# Patient Record
Sex: Female | Born: 1980 | Race: White | Hispanic: No | Marital: Married | Smoking: Never smoker
Health system: Southern US, Community
[De-identification: ages and names within clinical notes are randomized; demographics above are authoritative.]

## PROBLEM LIST (undated history)

## (undated) ENCOUNTER — Inpatient Hospital Stay (HOSPITAL_COMMUNITY): Payer: Self-pay

## (undated) ENCOUNTER — Inpatient Hospital Stay (HOSPITAL_COMMUNITY): Payer: BC Managed Care – PPO

## (undated) DIAGNOSIS — K911 Postgastric surgery syndromes: Secondary | ICD-10-CM

## (undated) DIAGNOSIS — F32A Depression, unspecified: Secondary | ICD-10-CM

## (undated) DIAGNOSIS — F329 Major depressive disorder, single episode, unspecified: Secondary | ICD-10-CM

## (undated) DIAGNOSIS — F419 Anxiety disorder, unspecified: Secondary | ICD-10-CM

## (undated) DIAGNOSIS — Z8659 Personal history of other mental and behavioral disorders: Secondary | ICD-10-CM

## (undated) DIAGNOSIS — Z8742 Personal history of other diseases of the female genital tract: Secondary | ICD-10-CM

## (undated) DIAGNOSIS — Z8489 Family history of other specified conditions: Secondary | ICD-10-CM

## (undated) DIAGNOSIS — E282 Polycystic ovarian syndrome: Secondary | ICD-10-CM

## (undated) DIAGNOSIS — Z8619 Personal history of other infectious and parasitic diseases: Secondary | ICD-10-CM

## (undated) DIAGNOSIS — D352 Benign neoplasm of pituitary gland: Secondary | ICD-10-CM

## (undated) HISTORY — DX: Polycystic ovarian syndrome: E28.2

## (undated) HISTORY — DX: Personal history of other infectious and parasitic diseases: Z86.19

## (undated) HISTORY — DX: Personal history of other mental and behavioral disorders: Z86.59

## (undated) HISTORY — DX: Depression, unspecified: F32.A

## (undated) HISTORY — DX: Anxiety disorder, unspecified: F41.9

## (undated) HISTORY — DX: Major depressive disorder, single episode, unspecified: F32.9

---
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
## 2002-06-06 HISTORY — PX: LAPAROSCOPIC CHOLECYSTECTOMY: SUR755

## 2005-03-03 ENCOUNTER — Ambulatory Visit: Payer: Self-pay | Admitting: Obstetrics and Gynecology

## 2012-06-06 DIAGNOSIS — D352 Benign neoplasm of pituitary gland: Secondary | ICD-10-CM

## 2012-06-06 HISTORY — DX: Benign neoplasm of pituitary gland: D35.2

## 2013-03-27 DIAGNOSIS — N915 Oligomenorrhea, unspecified: Secondary | ICD-10-CM | POA: Insufficient documentation

## 2013-03-27 DIAGNOSIS — L680 Hirsutism: Secondary | ICD-10-CM | POA: Insufficient documentation

## 2013-04-01 DIAGNOSIS — E281 Androgen excess: Secondary | ICD-10-CM | POA: Insufficient documentation

## 2013-06-06 HISTORY — PX: TONSILLECTOMY: SUR1361

## 2013-07-17 ENCOUNTER — Ambulatory Visit: Payer: BC Managed Care – PPO | Admitting: Endocrinology

## 2013-07-26 ENCOUNTER — Ambulatory Visit (INDEPENDENT_AMBULATORY_CARE_PROVIDER_SITE_OTHER): Payer: BC Managed Care – PPO | Admitting: Internal Medicine

## 2013-07-26 ENCOUNTER — Encounter (INDEPENDENT_AMBULATORY_CARE_PROVIDER_SITE_OTHER): Payer: Self-pay

## 2013-07-26 ENCOUNTER — Encounter: Payer: Self-pay | Admitting: Internal Medicine

## 2013-07-26 VITALS — BP 112/68 | HR 97 | Temp 98.3°F | Resp 12 | Ht 65.0 in | Wt 203.0 lb

## 2013-07-26 DIAGNOSIS — E282 Polycystic ovarian syndrome: Secondary | ICD-10-CM

## 2013-07-26 DIAGNOSIS — D353 Benign neoplasm of craniopharyngeal duct: Secondary | ICD-10-CM

## 2013-07-26 DIAGNOSIS — D352 Benign neoplasm of pituitary gland: Secondary | ICD-10-CM

## 2013-07-26 NOTE — Patient Instructions (Addendum)
Please return in 02/2014 for another visit. Continue Metformin 500 mg 2x a day. Let me know if you like me to refer you to a Reproductive Endocrinologist. If you do not need a referral, you can call make the appt yourself - I would be glad to send him my notes:  Dr. Marzetta Board [facility] [address] [place], [place], [address]. [phone] - can go to either [facility] or [employer]  Please collect a 24h urine for cortisol:  Patient information (Up-to-Date): Collection of a 24-hour urine specimen  - You should collect every drop of urine during each 24-hour period. It does not matter how much or little urine is passed each time, as long as every drop is collected. - Begin the urine collection in the morning after you wake up, after you have emptied your bladder for the first time. - Urinate (empty the bladder) for the first time and flush it down the toilet. Note the exact time (eg, 6:15 AM). You will begin the urine collection at this time. - Collect every drop of urine during the day and night in an empty collection bottle. Store the bottle at room temperature or in the refrigerator. - If you need to have a bowel movement, any urine passed with the bowel movement should be collected. Try not to include feces with the urine collection. If feces does get mixed in, do not try to remove the feces from the urine collection bottle. - Finish by collecting the first urine passed the next morning, adding it to the collection bottle. This should be within ten minutes before or after the time of the first morning void on the first day (which was flushed). In this example, you would try to void between 6:05 and 6:25 on the second day. - If you need to urinate one hour before the final collection time, drink a full glass of water so that you can void again at the appropriate time. If you have to urinate 20 minutes before, try to hold the urine until the proper  time. - Please note the exact time of the final collection, even if it is not the same time as when collection began on day 1. - The bottle(s) may be kept at room temperature for a day or two, but should be kept cool or refrigerated for longer periods of time.

## 2013-07-26 NOTE — Progress Notes (Signed)
Patient ID: Krystal Carr, female   DOB: December 15, 1980, 33 y.o.   MRN: [id number]   HPI  Krystal Carr is a 33 y.o.-year-old female, referred by her PCP, Dr. Gala Lewandowsky, MD, for evaluation for a pituitary microadenoma.  Patient describes that she initially presented to her PCP last year c/o irregular menses - prior to birth of son in 2010 she has had down to 2 menstrual cycles a year, however, after the birth of her son, she slowly increased the frequency until she started to bleed 2x a month. Also, her anxiety and depression worsened. She also could not lose weight. Her PCP checked a prolactin and this was low and a repeated one was even lower (initially 7 then 4) and she ordered a pituitary MRI (01/05/2013) that showed a 2 mm pituitary microadenoma. She was then referred to an endocrinologist which she has seen in 02/2013 and had further evaluation, which was negative, except for a slightly high DHEAS. We obtained these labs from [facility] (please see below).  Pt c/o: - + weight gain : ~20 lbs in last 4 year. Baseline weigh 160-180 lbs. - stomach and thighs - Had weight loss 15 lbs, then regained - no increased fatigue - no constipation - no dry skin - no hair falling - + depression and anxiety >> started Wellbutrin for 1.5 years >> then weaned off >> now better w/o it - no increase ring or shoe size - no excessive sweating - + HAs - was on Imitrex and Maxalt; now better after birth of son; severe HAs: 1 out of 1-2 mo - + easy bruising - no wide, purple striae - no AP - ? Infertility; no OCPs and no protection for last 3 years  - increased hair on chin in last 1.5 years - no acne - no proximal mm weakness  I reviewed her records from [facility]: - 01/05/2013 pituitary MRI +/- contrast from [facility], read by [facility] radiology: no focal parenchymal signal abnormality identified. No midline shift or mass lesion. No evidence of intracranial hemorrhage or acute infarct. No extra-axial fluid  collection present. No diffusion weighted signal abnormality identified. Mild enlargement of the right pituitary with a 2 mm region of hypoenhancement seen in the superior aspect of the right pituitary. No suprasellar mass. The optic chiasm is normal. Post contrast imaging shows no abnormal areas of contrast enhancement within the pituitary gland. No abnormal enhancement noted elsewhere in the brain. Impression: 2 mm hypoenhancing right pituitary mass, which could represent a microadenoma. - 04/12/2013 MRI abdomen +/- contrast performed due to history of hyperandrogenism: no adrenal mass identified. Multiple tiny follicles in both ovaries. If PCOS is of clinical concern, pelvic ultrasound may be obtained further - 02/20/2013: Initial pituitary visit with Dr. Mamie Levers:  TSH 1.82 (0.6-3.3), Free T4 1.18 DHEAS 361 (31-228)Testosterone 56 (6-77)  IGF-I 171 (77-271) Hemoglobin A1c 4.8% HCG quantitative <5 17 hydroxyprogesterone 52 Repeat DHEAS 03/27/2013: 368  24 hour urine for cortisol was collected, we are waiting for the results Patient was trying to conceive. She already had a son born in 2010, spontaneously conceived after 3 years. Husband had azoospermia (Oct 2014), after using exogenous testosterone. Pt had irregular menstrual cycles, which are not new for her, they were irregular prior to pregnancy, approximately 3-6 per year, now more frequently. She also complains of migraines. Denied gynecomastia or galactorrhea. Denied enlargement of the ring or shoe size, no excessive sweating. She has gained weight 18 pounds in the last year; her BMI at the  time of the visit was 32.7. It was mentioned that the patient complained of infertility and hirsutism and her labs show elevated DHEAS.  Labs prior to this visit: 01/05/2013: Prolactin 4.0 12/28/2012: LH 13, FSH 6, TSH 4.48 (0.45-4.5), prolactin 4.6, total T4 8.9 - all normal 07/23/2012: Prolactin 7.2, TSH 3.09, estrogen 155, LH 10, FSH 5, hCG less  than 1, CMP normal CBC normal  She was started on metformin for presumed PCOS >> she started it last month.  ROS: Constitutional: see HPI Eyes: no blurry vision, no xerophthalmia ENT: no sore throat, + nodules palpated in throat, no dysphagia/odynophagia, no hoarseness Cardiovascular: no CP/SOB/palpitations/leg swelling Respiratory: no cough/SOB Gastrointestinal: no N/V/D/C Musculoskeletal: no muscle/joint aches Skin: no rashes Neurological: no tremors/numbness/tingling/dizziness Psychiatric: + depression/ + anxiety Low libido  Past Medical History  Diagnosis Date  . Allergy   . Anxiety   . Depression    Past Surgical History  Procedure Laterality Date  . Appendectomy  1997  . Cholecystectomy  2004   History   Social History  . Marital Status: Married    Spouse Name: N/A    Number of Children: 48, son born 2010    Occupational History  . Receptionist @ [employer]   Social History Main Topics  . Smoking status: Never Smoker   . Smokeless tobacco: No  . Alcohol Use: No  . Drug Use: No  . Sexual Activity: unrotected intercourse x last 3 years   Current Outpatient Rx  Name  Route  Sig  Dispense  Refill  . colestipol (COLESTID) 1 G tablet   Oral   Take 1 g by mouth daily.         Marland Kitchen loratadine (CLARITIN) 10 MG tablet   Oral   Take 10 mg by mouth daily.         . metFORMIN (GLUCOPHAGE) 500 MG tablet   Oral   Take 500 mg by mouth 2 (two) times daily with a meal.         . triamcinolone (NASACORT AQ) 55 MCG/ACT AERO nasal inhaler   Nasal   Place 1 spray into the nose 2 (two) times daily.          Allergies  Allergen Reactions  . Ceclor [Cefaclor] Hives  . Imitrex [Sumatriptan] Swelling    Facial and oral swelling  . Minocycline Hives   Family History  Problem Relation Age of Onset  . Hyperlipidemia Mother   . Cancer Mother 71    Breast Cancer   . Hypertension Father   . Heart disease Father   . Cancer Paternal Grandmother     Breast  Cancer    PE: BP 112/68  Pulse 97  Temp(Src) 98.3 F (36.8 C) (Oral)  Resp 12  Ht 5\' 5"  (1.651 m)  Wt 203 lb (92.08 kg)  BMI 33.78 kg/m2  SpO2 97%  LMP 07/15/2013 Wt Readings from Last 3 Encounters:  07/26/13 203 lb (92.08 kg)   Constitutional: overweight, in NAD, moon facies Eyes: PERRLA, EOMI, no exophthalmos ENT: moist mucous membranes, no thyromegaly, no cervical lymphadenopathy Cardiovascular: RRR, No MRG Respiratory: CTA B Gastrointestinal: abdomen soft, NT, ND, BS+ Musculoskeletal: no deformities, strength intact in all 4 Skin: moist, warm, no rashes, vellum on sideburns, darker hair shafts on chin. No skin tags. Neurological: no tremor with outstretched hands, DTR normal in all 4  ASSESSMENT: 1. Pituitary microadenoma - 2 mm   2. PCOS - multiple ovarian follicles per abdominal MRI - high DHEAS - normal  adrenals per abdominal MRI  PLAN:  1. Pituitary microadenoma - I discussed with the patient that this is very small, and, per her pituitary labs checked in /2014, the pituitary function is normal: - IGF-I is normal, therefore no acromegaly - Her prolactin level was normal, therefore no prolactinoma - thyroid tests were normal, therefore no TSHoma - she was also asked to provide a urine collection, which he did, however this was lost by the lab (lab corp) - she has no symptoms and signs that might indicate adrenal insufficiency - in this case, I suggested that I see her again in 02/2014, which will be a year after her previous hormonal evaluation, and we will repeat her pituitary function - I would like to get a 24-hour urine cortisol collection, however, and I gave her instructions about how to do that - We discussed about a repeat MRI, which is indicated at 1-2 years after diagnosis in case of a microadenoma, or tumor <1 cm (and 6 months after diagnosis in case of a macroadenoma, or a tumor >1 cm)  2. Elevated DHEAS - Most likely PCOS-related; however, we  discussed about the possibility that she has an isolated increased secretion of DHEAS - I would recheck her DHEAS and testosterone at next visit - we discussed about the fact that she should stay on metformin, and the current dose of 500 mg twice a day which she just started, and if she does not get pregnant in the next 6 months, I can refer her to reproductive endocrinology. If she does get pregnant, I would continue the metformin for the duration of the pregnancy. We also would need to have her followed by High-Risk OB/GYN. - patient agrees with the plan  Component     Latest Ref Rng 07/29/2013  Cortisol (Ur), Free     4.0 - 50.0 mcg/24 h 30.3  Results received     0.63 - 2.50 g/24 h 1.96  Creatinine, Urine      141.3  Creatinine, 24H Ur     700 - 1800 mg/day 1978 (H)  No elevated cortisol in urine. Cr a little high.

## 2013-07-29 LAB — CREATININE, URINE, 24 HOUR
CREATININE, URINE: 141.3 mg/dL
Creatinine, 24H Ur: 1978 mg/d — ABNORMAL HIGH (ref 700–1800)

## 2013-07-31 ENCOUNTER — Encounter: Payer: Self-pay | Admitting: Internal Medicine

## 2013-08-05 ENCOUNTER — Encounter: Payer: Self-pay | Admitting: Internal Medicine

## 2013-08-05 DIAGNOSIS — D352 Benign neoplasm of pituitary gland: Secondary | ICD-10-CM | POA: Insufficient documentation

## 2013-08-05 DIAGNOSIS — E282 Polycystic ovarian syndrome: Secondary | ICD-10-CM | POA: Insufficient documentation

## 2013-08-05 LAB — CORTISOL, URINE, 24 HOUR
Cortisol (Ur), Free: 30.3 mcg/24 h (ref 4.0–50.0)
RESULTS RECEIVED: 1.96 g/(24.h) (ref 0.63–2.50)

## 2013-08-12 ENCOUNTER — Encounter: Payer: Self-pay | Admitting: Internal Medicine

## 2013-11-04 ENCOUNTER — Encounter: Payer: Self-pay | Admitting: Internal Medicine

## 2013-11-06 ENCOUNTER — Other Ambulatory Visit: Payer: Self-pay | Admitting: Internal Medicine

## 2013-11-06 DIAGNOSIS — D352 Benign neoplasm of pituitary gland: Secondary | ICD-10-CM

## 2013-11-22 ENCOUNTER — Ambulatory Visit
Admission: RE | Admit: 2013-11-22 | Discharge: 2013-11-22 | Disposition: A | Discharge status: Home / Self Care | Payer: BC Managed Care – PPO | Source: Ambulatory Visit | Attending: Internal Medicine | Admitting: Internal Medicine

## 2013-11-22 MED ORDER — GADOBENATE DIMEGLUMINE 529 MG/ML IV SOLN
9.0000 mL | Freq: Once | INTRAVENOUS | Status: AC | PRN
  Administered 2013-11-22: 9 mL via INTRAVENOUS

## 2013-12-18 DIAGNOSIS — N469 Male infertility, unspecified: Secondary | ICD-10-CM | POA: Insufficient documentation

## 2014-02-28 ENCOUNTER — Ambulatory Visit: Payer: BC Managed Care – PPO | Admitting: Internal Medicine

## 2014-03-27 ENCOUNTER — Ambulatory Visit: Payer: BC Managed Care – PPO | Admitting: Internal Medicine

## 2014-06-06 ENCOUNTER — Telehealth: Payer: Self-pay | Admitting: Nurse Practitioner

## 2014-06-06 DIAGNOSIS — N3000 Acute cystitis without hematuria: Secondary | ICD-10-CM

## 2014-06-06 MED ORDER — SULFAMETHOXAZOLE-TRIMETHOPRIM 800-160 MG PO TABS: 1.0000 | ORAL_TABLET | Freq: Two times a day (BID) | ORAL | Status: DC

## 2014-06-06 NOTE — L&D Delivery Note (Signed)
Delivery Note At 8:10 PM a viable female was delivered via NSVD (Presentation:OA ;  ).  APGAR:8/9 , ; weight  .   Placenta status:INTACT , . 3 VESSEL Cord:  with the following complications:NONE .  Cord pH: NA  Anesthesia: Epidural  Episiotomy: None Lacerations: 1st degree;Vaginal Suture Repair: 2.0 chromic Est. Blood Loss (mL):  350CC  Mom to postpartum.  Baby to Couplet care / Skin to Skin.  Solan Vosler S 05/08/2015, 8:21 PM

## 2014-06-06 NOTE — Progress Notes (Signed)

## 2014-10-02 LAB — OB RESULTS CONSOLE HEPATITIS B SURFACE ANTIGEN: HEP B S AG: NEGATIVE

## 2014-10-02 LAB — OB RESULTS CONSOLE RPR: RPR: NONREACTIVE

## 2014-10-02 LAB — OB RESULTS CONSOLE ABO/RH: RH Type: POSITIVE

## 2014-10-02 LAB — OB RESULTS CONSOLE ANTIBODY SCREEN: ANTIBODY SCREEN: NEGATIVE

## 2014-10-02 LAB — OB RESULTS CONSOLE GC/CHLAMYDIA
CHLAMYDIA, DNA PROBE: NEGATIVE
GC PROBE AMP, GENITAL: NEGATIVE

## 2014-10-02 LAB — OB RESULTS CONSOLE HIV ANTIBODY (ROUTINE TESTING): HIV: NONREACTIVE

## 2014-10-02 LAB — OB RESULTS CONSOLE RUBELLA ANTIBODY, IGM: Rubella: IMMUNE

## 2014-12-14 ENCOUNTER — Telehealth: Payer: 59 | Admitting: Family

## 2014-12-14 DIAGNOSIS — R112 Nausea with vomiting, unspecified: Secondary | ICD-10-CM

## 2014-12-14 MED ORDER — PROMETHAZINE HCL 12.5 MG PO TABS: 12.5000 mg | ORAL_TABLET | Freq: Four times a day (QID) | ORAL | Status: DC | PRN

## 2014-12-14 NOTE — Progress Notes (Signed)
We are sorry that you are not feeling well.  Here is how we plan to help!  Based on what you have shared with me it looks like you have Acute Infectious Diarrhea.  Most cases of acute diarrhea are due to infections with virus and bacteria and are self-limited conditions lasting less than 14 days.  For your symptoms you may take Imodium 2 mg tablets that are over the counter at your local pharmacy. Take two tablet now and then one after each loose stool up to 6 a day.  Antibiotics are not needed for most people with diarrhea.  I have sent Phenergan 12.5mg  every 6 hours as needed for nausea/vomiting.   As per our phone conversation, both Phenergan and Imodium are Category C. However, the research indicates the highest risk during the first 12 weeks and last 8 weeks of pregnancy for these medications and you indicated that you are at 19 weeks. Therefore, the risk is likely much less in your particular case. Additionally, you must weigh the risk-benefit ratio in this situation in that if you continue to vomit, you risk becoming dehydrated, deficient in vital nutrients, and in a poor state of health which will also negatively impact your baby's health. Therefore, short-term use of these medications may prevent long-term illness and detrimental effects. I will send the prescriptions and you can make an informed decision about taking them if you choose to do so.   HOME CARE  We recommend changing your diet to help with your symptoms for the next few days.  Drink plenty of fluids that contain water salt and sugar. Sports drinks such as Gatorade may help.   You may try broths, soups, bananas, applesauce, soft breads, mashed potatoes or crackers.   You are considered infectious for as long as the diarrhea continues. Hand washing or use of alcohol based hand sanitizers is recommend.  It is best to stay out of work or school until your symptoms stop.   GET HELP RIGHT AWAY  If you have dark yellow  colored urine or do not pass urine frequently you should drink more fluids.    If your symptoms worsen   If you feel like you are going to pass out (faint)  You have a new problem  MAKE SURE YOU   Understand these instructions.  Will watch your condition.  Will get help right away if you are not doing well or get worse.  Your e-visit answers were reviewed by a board certified advanced clinical practitioner to complete your personal care plan.  Depending on the condition, your plan could have included both over the counter or prescription medications.  If there is a problem please reply  once you have received a response from your provider.  Your safety is important to [place].  If you have drug allergies check your prescription carefully.    You can use MyChart to ask questions about today's visit, request a non-urgent call back, or ask for a work or school excuse.  You will get an e-mail in the next two days asking about your experience.  I hope that your e-visit has been valuable and will speed your recovery. Thank you for using e-visits.

## 2015-01-24 ENCOUNTER — Encounter (HOSPITAL_COMMUNITY): Payer: Self-pay | Admitting: *Deleted

## 2015-01-24 ENCOUNTER — Inpatient Hospital Stay (HOSPITAL_COMMUNITY)
Admission: AD | Admit: 2015-01-24 | Discharge: 2015-01-25 | Disposition: A | Discharge status: Home / Self Care | Payer: BLUE CROSS/BLUE SHIELD | Source: Ambulatory Visit | Attending: Obstetrics and Gynecology | Admitting: Obstetrics and Gynecology

## 2015-01-24 DIAGNOSIS — Z3689 Encounter for other specified antenatal screening: Secondary | ICD-10-CM

## 2015-01-24 DIAGNOSIS — O368120 Decreased fetal movements, second trimester, not applicable or unspecified: Secondary | ICD-10-CM | POA: Diagnosis present

## 2015-01-24 DIAGNOSIS — Z3A24 24 weeks gestation of pregnancy: Secondary | ICD-10-CM | POA: Diagnosis not present

## 2015-01-24 DIAGNOSIS — O368121 Decreased fetal movements, second trimester, fetus 1: Secondary | ICD-10-CM

## 2015-01-24 LAB — URINALYSIS, ROUTINE W REFLEX MICROSCOPIC
Bilirubin Urine: NEGATIVE
GLUCOSE, UA: 100 mg/dL — AB
HGB URINE DIPSTICK: NEGATIVE
Ketones, ur: NEGATIVE mg/dL
LEUKOCYTES UA: NEGATIVE
Nitrite: NEGATIVE
Protein, ur: NEGATIVE mg/dL
SPECIFIC GRAVITY, URINE: 1.01 (ref 1.005–1.030)
UROBILINOGEN UA: 0.2 mg/dL (ref 0.0–1.0)
pH: 6 (ref 5.0–8.0)

## 2015-01-24 NOTE — Discharge Instructions (Signed)

## 2015-01-24 NOTE — MAU Provider Note (Signed)
Chief Complaint:  Decreased Fetal Movement   First Provider Initiated Contact with Patient 01/24/15 2345      HPI: Krystal Carr is a 34 y.o. G3P1011 at 59w3dwho presents to maternity admissions reporting decreased fetal movement today. She reports feeling large kick this morning then no movement all day until this afternoon when she felt some small flutters only.  She is feeling movement since arriving in MAU.   She denies abdominal pain, LOF, vaginal bleeding, vaginal itching/burning, urinary symptoms, h/a, dizziness, n/v, or fever/chills.    HPI  Past Medical History: Past Medical History  Diagnosis Date  . Allergy   . Anxiety   . Depression     Past obstetric history: OB History  Gravida Para Term Preterm AB SAB TAB Ectopic Multiple Living  3 1 1  1 1    1     # Outcome Date GA Lbr Len/2nd Weight Sex Delivery Anes PTL Lv  3 Current           2 SAB 01/14/14          1 Term 04/21/09    M Vag-Spont   Y      Past Surgical History: Past Surgical History  Procedure Laterality Date  . Appendectomy  1997  . Cholecystectomy  2004    Family History: Family History  Problem Relation Age of Onset  . Hyperlipidemia Mother   . Cancer Mother 36    Breast Cancer   . Hypertension Father   . Heart disease Father   . Cancer Paternal Grandmother     Breast Cancer     Social History: Social History  Substance Use Topics  . Smoking status: Never Smoker   . Smokeless tobacco: None  . Alcohol Use: No    Allergies:  Allergies  Allergen Reactions  . Ceclor [Cefaclor] Hives  . Imitrex [Sumatriptan] Swelling    Facial and oral swelling  . Minocycline Hives    Meds:  Prescriptions prior to admission  Medication Sig Dispense Refill Last Dose  . colestipol (COLESTID) 1 G tablet Take 1 g by mouth daily.   01/24/2015 at Unknown time  . Prenatal Vit-Fe Fumarate-FA (PRENATAL MULTIVITAMIN) TABS tablet Take 1 tablet by mouth daily at 12 noon.   01/24/2015 at Unknown time  .  loratadine (CLARITIN) 10 MG tablet Take 10 mg by mouth daily.   Taking  . metFORMIN (GLUCOPHAGE) 500 MG tablet Take 500 mg by mouth 2 (two) times daily with a meal.   Taking  . promethazine (PHENERGAN) 12.5 MG tablet Take 1 tablet (12.5 mg total) by mouth every 6 (six) hours as needed for nausea or vomiting. 20 tablet 0 More than a month at Unknown time  . sulfamethoxazole-trimethoprim (BACTRIM DS,SEPTRA DS) 800-160 MG per tablet Take 1 tablet by mouth 2 (two) times daily. 14 tablet 0   . triamcinolone (NASACORT AQ) 55 MCG/ACT AERO nasal inhaler Place 1 spray into the nose 2 (two) times daily.   Taking    ROS:  Review of Systems  Constitutional: Negative for fever, chills and fatigue.  HENT: Negative for sinus pressure.   Eyes: Negative for photophobia.  Respiratory: Negative for shortness of breath.   Cardiovascular: Negative for chest pain.  Gastrointestinal: Negative for nausea, vomiting, diarrhea and constipation.  Genitourinary: Negative for dysuria, frequency, flank pain, vaginal bleeding, vaginal discharge, difficulty urinating, vaginal pain and pelvic pain.  Musculoskeletal: Negative for neck pain.  Neurological: Negative for dizziness, weakness and headaches.  Psychiatric/Behavioral: Negative.  I have reviewed patient's Past Medical Hx, Surgical Hx, Family Hx, Social Hx, medications and allergies.   Physical Exam  Patient Vitals for the past 24 hrs:  BP Temp Pulse Resp SpO2 Height Weight  01/24/15 2339 137/82 mmHg - 107 - - - -  01/24/15 2315 - - - - 100 % - -  01/24/15 2310 144/84 mmHg 98.4 F (36.9 C) 116 18 - 5\' 5"  (1.651 m) 95.89 kg (211 lb 6.4 oz)   Constitutional: Well-developed, well-nourished female in no acute distress.  Cardiovascular: normal rate Respiratory: normal effort GI: Abd soft, non-tender, gravid appropriate for gestational age. MS: Extremities nontender, no edema, normal ROM Neurologic: Alert and oriented x 4.  GU: Deferred     FHT:   Baseline 145 , moderate variability, accelerations present, no decelerations Contractions: None on toco or to palpation   Labs: Results for orders placed or performed during the hospital encounter of 01/24/15 (from the past 24 hour(s))  Urinalysis, Routine w reflex microscopic (not at [facility])     Status: Abnormal   Collection Time: 01/24/15 11:20 PM  Result Value Ref Range   Color, Urine YELLOW YELLOW   APPearance CLEAR CLEAR   Specific Gravity, Urine 1.010 1.005 - 1.030   pH 6.0 5.0 - 8.0   Glucose, UA 100 (A) NEGATIVE mg/dL   Hgb urine dipstick NEGATIVE NEGATIVE   Bilirubin Urine NEGATIVE NEGATIVE   Ketones, ur NEGATIVE NEGATIVE mg/dL   Protein, ur NEGATIVE NEGATIVE mg/dL   Urobilinogen, UA 0.2 0.0 - 1.0 mg/dL   Nitrite NEGATIVE NEGATIVE   Leukocytes, UA NEGATIVE NEGATIVE    MAU Course/MDM: I have ordered labs and reviewed results.  Consult Dr Matthew Saras, reviewed FHR tracing.  Pt stable at time of discharge.  Assessment: 1. Decreased fetal movement, second trimester, fetus 1   2. NST (non-stress test) reactive     Plan: Consult Dr Matthew Saras, reviewed assessment and FHR tracing Discharge home PTL precautions and fetal kick counts  Follow-up Information    Follow up with Margarette Asal, MD.   Specialty:  Obstetrics and Gynecology   Why:  As scheduled   Contact information:   [address] [place] [address] [phone]       Follow up with [place].   Why:  As needed for emergencies   Contact information:   [address] [phone] [place] [phone]       Medication List    STOP taking these medications        sulfamethoxazole-trimethoprim 800-160 MG per tablet  Commonly known as:  BACTRIM DS,SEPTRA DS      TAKE these medications        colestipol 1 G tablet  Commonly known as:  COLESTID  Take 1 g by mouth daily.     loratadine 10 MG tablet   Commonly known as:  CLARITIN  Take 10 mg by mouth daily.     metFORMIN 500 MG tablet  Commonly known as:  GLUCOPHAGE  Take 500 mg by mouth 2 (two) times daily with a meal.     NASACORT AQ 55 MCG/ACT Aero nasal inhaler  Generic drug:  triamcinolone  Place 1 spray into the nose 2 (two) times daily.     prenatal multivitamin Tabs tablet  Take 1 tablet by mouth daily at 12 noon.     promethazine 12.5 MG tablet  Commonly known as:  PHENERGAN  Take 1 tablet (12.5 mg  total) by mouth every 6 (six) hours as needed for nausea or vomiting.        Fatima Blank Certified Nurse-Midwife 01/24/2015 11:57 PM

## 2015-01-24 NOTE — Progress Notes (Signed)
Baby active and pt aware of FM

## 2015-01-24 NOTE — MAU Note (Signed)
Decreased FM for past 24hrs. Feeling some FM but not like usual. Ankels swollen last few days but better today after being off feet. Alittle SOB past few days at times

## 2015-01-25 DIAGNOSIS — O368120 Decreased fetal movements, second trimester, not applicable or unspecified: Secondary | ICD-10-CM | POA: Diagnosis not present

## 2015-01-25 NOTE — Progress Notes (Signed)
Fatima Blank CNM in to see pt

## 2015-01-25 NOTE — Progress Notes (Signed)
Written and verbal d/c instructions given and understanding vocied. Fetal kick count reviewed and voices understanding

## 2015-03-16 ENCOUNTER — Inpatient Hospital Stay (HOSPITAL_COMMUNITY)
Admission: AD | Admit: 2015-03-16 | Discharge: 2015-03-16 | Disposition: A | Discharge status: Home / Self Care | Payer: BLUE CROSS/BLUE SHIELD | Source: Ambulatory Visit | Attending: Obstetrics & Gynecology | Admitting: Obstetrics & Gynecology

## 2015-03-16 ENCOUNTER — Encounter (HOSPITAL_COMMUNITY): Payer: Self-pay | Admitting: *Deleted

## 2015-03-16 DIAGNOSIS — D649 Anemia, unspecified: Secondary | ICD-10-CM

## 2015-03-16 DIAGNOSIS — O99013 Anemia complicating pregnancy, third trimester: Secondary | ICD-10-CM | POA: Diagnosis not present

## 2015-03-16 DIAGNOSIS — R000 Tachycardia, unspecified: Secondary | ICD-10-CM | POA: Diagnosis not present

## 2015-03-16 DIAGNOSIS — R51 Headache: Secondary | ICD-10-CM

## 2015-03-16 DIAGNOSIS — O26893 Other specified pregnancy related conditions, third trimester: Secondary | ICD-10-CM

## 2015-03-16 DIAGNOSIS — Z3A31 31 weeks gestation of pregnancy: Secondary | ICD-10-CM | POA: Diagnosis not present

## 2015-03-16 LAB — CBC
HEMATOCRIT: 33.1 % — AB (ref 36.0–46.0)
Hemoglobin: 10.9 g/dL — ABNORMAL LOW (ref 12.0–15.0)
MCH: 27.6 pg (ref 26.0–34.0)
MCHC: 32.9 g/dL (ref 30.0–36.0)
MCV: 83.8 fL (ref 78.0–100.0)
PLATELETS: 233 10*3/uL (ref 150–400)
RBC: 3.95 MIL/uL (ref 3.87–5.11)
RDW: 13.8 % (ref 11.5–15.5)
WBC: 8 10*3/uL (ref 4.0–10.5)

## 2015-03-16 LAB — COMPREHENSIVE METABOLIC PANEL
ALT: 14 U/L (ref 14–54)
AST: 13 U/L — AB (ref 15–41)
Albumin: 2.8 g/dL — ABNORMAL LOW (ref 3.5–5.0)
Alkaline Phosphatase: 83 U/L (ref 38–126)
Anion gap: 6 (ref 5–15)
BUN: 7 mg/dL (ref 6–20)
CHLORIDE: 107 mmol/L (ref 101–111)
CO2: 23 mmol/L (ref 22–32)
CREATININE: 0.54 mg/dL (ref 0.44–1.00)
Calcium: 8.4 mg/dL — ABNORMAL LOW (ref 8.9–10.3)
GFR calc Af Amer: >60 mL/min (ref >60)
Glucose, Bld: 86 mg/dL (ref 65–99)
Potassium: 3.8 mmol/L (ref 3.5–5.1)
Sodium: 136 mmol/L (ref 135–145)
Total Bilirubin: 0.3 mg/dL (ref 0.3–1.2)
Total Protein: 6.7 g/dL (ref 6.5–8.1)

## 2015-03-16 LAB — URINALYSIS, ROUTINE W REFLEX MICROSCOPIC
BILIRUBIN URINE: NEGATIVE
Glucose, UA: 250 mg/dL — AB
Hgb urine dipstick: NEGATIVE
Ketones, ur: 15 mg/dL — AB
LEUKOCYTES UA: NEGATIVE
NITRITE: NEGATIVE
Protein, ur: NEGATIVE mg/dL
SPECIFIC GRAVITY, URINE: 1.025 (ref 1.005–1.030)
UROBILINOGEN UA: 0.2 mg/dL (ref 0.0–1.0)
pH: 6 (ref 5.0–8.0)

## 2015-03-16 NOTE — MAU Note (Signed)
Patient presents at [redacted] weeks gestation with c/o intermittent hypertension X couple of weeks, tachycardia, nausea and headache X 2 days. Went to family doctor today. Vitals taken and EKG completed. Assessment completed as well and then family doctor consulted her OB-GYN who directed her to MAU. Fetus active. Denies bleeding or discharge.

## 2015-03-16 NOTE — MAU Provider Note (Signed)
History     CSN: [id number]  Arrival date and time: 03/16/15 1844   First Provider Initiated Contact with Patient 03/16/15 1930         Chief Complaint  Patient presents with  . Hypertension  . Headache  . Nausea  . Tachycardia   HPI Krystal Carr is a 34 y.o. G3P1011 at [redacted]w[redacted]d who was sent from her PCP for headache, tachycardia & elevated BP.  Went to PCP today b/c BP was up at home & felt like heart was racing. Evaluation done there, pt came with records. EKG at office - sinus tachycardia, otherwise normal.  Headaches off & on x 2 days, states worse when BP is up. Took tylenol today at 1230 with some relief.  Currently rates headache as 3/10.  Denies vision changes.  Nausea since Saturday. Vomited once on Saturday, none since. Has zofran & phenergan at home but hasn't taken anything.  Denies ctx, LOF, vaginal bleeding.  Positive fetal movement.     OB History    Gravida Para Term Preterm AB TAB SAB Ectopic Multiple Living   3 1 1  1  1   1       Past Medical History  Diagnosis Date  . Allergy   . Anxiety   . Depression     Past Surgical History  Procedure Laterality Date  . Appendectomy  1997  . Cholecystectomy  2004    Family History  Problem Relation Age of Onset  . Hyperlipidemia Mother   . Cancer Mother 45    Breast Cancer   . Hypertension Father   . Heart disease Father   . Cancer Paternal Grandmother     Breast Cancer     Social History  Substance Use Topics  . Smoking status: Never Smoker   . Smokeless tobacco: None  . Alcohol Use: No    Allergies:  Allergies  Allergen Reactions  . Ceclor [Cefaclor] Hives  . Imitrex [Sumatriptan] Swelling    Facial and oral swelling  . Minocycline Hives    Prescriptions prior to admission  Medication Sig Dispense Refill Last Dose  . colestipol (COLESTID) 1 G tablet Take 1 g by mouth daily.   01/24/2015 at Unknown time  . loratadine (CLARITIN) 10 MG tablet Take 10 mg by mouth daily.   Taking  .  metFORMIN (GLUCOPHAGE) 500 MG tablet Take 500 mg by mouth 2 (two) times daily with a meal.   Taking  . Prenatal Vit-Fe Fumarate-FA (PRENATAL MULTIVITAMIN) TABS tablet Take 1 tablet by mouth daily at 12 noon.   01/24/2015 at Unknown time  . promethazine (PHENERGAN) 12.5 MG tablet Take 1 tablet (12.5 mg total) by mouth every 6 (six) hours as needed for nausea or vomiting. 20 tablet 0 More than a month at Unknown time  . triamcinolone (NASACORT AQ) 55 MCG/ACT AERO nasal inhaler Place 1 spray into the nose 2 (two) times daily.   Taking    Review of Systems  Constitutional: Negative.   Eyes: Negative for blurred vision and double vision.  Respiratory: Negative.   Cardiovascular: Positive for palpitations. Negative for chest pain.  Gastrointestinal: Positive for nausea. Negative for vomiting.  Genitourinary: Negative.   Neurological: Positive for headaches. Negative for dizziness.   Physical Exam   Blood pressure 113/74, pulse 112, temperature 98.2 F (36.8 C), temperature source Oral, resp. rate 18, height 5\' 5"  (1.651 m), weight 211 lb (95.709 kg), SpO2 100 %.  Patient Vitals for the past 24 hrs:  BP Temp  Temp src Pulse Resp SpO2 Height Weight  03/16/15 2002 110/76 mmHg - - 102 - - - -  03/16/15 1947 107/73 mmHg - - 100 - - - -  03/16/15 1932 106/72 mmHg - - 111 - - - -  03/16/15 1922 113/74 mmHg 98.2 F (36.8 C) Oral 112 18 100 % 5\' 5"  (1.651 m) 211 lb (95.709 kg)   Fetal Tracing:  Baseline: 145 Variability: moderate Accelerations: 15x15 Decelerations: none  Toco: none    Physical Exam  Nursing note and vitals reviewed. Constitutional: She is oriented to person, place, and time. She appears well-developed and well-nourished. No distress.  HENT:  Head: Normocephalic and atraumatic.  Eyes: Conjunctivae are normal. Right eye exhibits no discharge. Left eye exhibits no discharge. No scleral icterus.  Neck: Normal range of motion.  Cardiovascular: Normal rate, regular rhythm and  normal heart sounds.   No murmur heard. Respiratory: Effort normal and breath sounds normal. No respiratory distress. She has no wheezes.  GI: Soft.  Neurological: She is alert and oriented to person, place, and time. She has normal reflexes.  No clonus  Skin: Skin is warm and dry. She is not diaphoretic.  Psychiatric: She has a normal mood and affect. Her behavior is normal. Judgment and thought content normal.    MAU Course  Procedures Results for orders placed or performed during the hospital encounter of 03/16/15 (from the past 24 hour(s))  Urinalysis, Routine w reflex microscopic (not at [facility])     Status: Abnormal   Collection Time: 03/16/15  7:03 PM  Result Value Ref Range   Color, Urine YELLOW YELLOW   APPearance CLEAR CLEAR   Specific Gravity, Urine 1.025 1.005 - 1.030   pH 6.0 5.0 - 8.0   Glucose, UA 250 (A) NEGATIVE mg/dL   Hgb urine dipstick NEGATIVE NEGATIVE   Bilirubin Urine NEGATIVE NEGATIVE   Ketones, ur 15 (A) NEGATIVE mg/dL   Protein, ur NEGATIVE NEGATIVE mg/dL   Urobilinogen, UA 0.2 0.0 - 1.0 mg/dL   Nitrite NEGATIVE NEGATIVE   Leukocytes, UA NEGATIVE NEGATIVE  CBC     Status: Abnormal   Collection Time: 03/16/15  8:04 PM  Result Value Ref Range   WBC 8.0 4.0 - 10.5 K/uL   RBC 3.95 3.87 - 5.11 MIL/uL   Hemoglobin 10.9 (L) 12.0 - 15.0 g/dL   HCT 33.1 (L) 36.0 - 46.0 %   MCV 83.8 78.0 - 100.0 fL   MCH 27.6 26.0 - 34.0 pg   MCHC 32.9 30.0 - 36.0 g/dL   RDW 13.8 11.5 - 15.5 %   Platelets 233 150 - 400 K/uL  Comprehensive metabolic panel     Status: Abnormal   Collection Time: 03/16/15  8:04 PM  Result Value Ref Range   Sodium 136 135 - 145 mmol/L   Potassium 3.8 3.5 - 5.1 mmol/L   Chloride 107 101 - 111 mmol/L   CO2 23 22 - 32 mmol/L   Glucose, Bld 86 65 - 99 mg/dL   BUN 7 6 - 20 mg/dL   Creatinine, Ser 0.54 0.44 - 1.00 mg/dL   Calcium 8.4 (L) 8.9 - 10.3 mg/dL   Total Protein 6.7 6.5 - 8.1 g/dL   Albumin 2.8 (L) 3.5 - 5.0 g/dL   AST 13 (L) 15 - 41  U/L   ALT 14 14 - 54 U/L   Alkaline Phosphatase 83 38 - 126 U/L   Total Bilirubin 0.3 0.3 - 1.2 mg/dL   GFR calc non Af  Amer >60 >60 mL/min   GFR calc Af Amer >60 >60 mL/min   Anion gap 6 5 - 15    MDM 1947- C/w Dr. Lynnette Caffey. CBC, CMP, BP monitoring & urinalysis Category 1 tracing, no contractions 2042- Discussed results with Dr. Lynnette Caffey. Ok to discharge home.  Assessment and Plan  A: 1. Tachycardia with 100 - 120 beats per minute   2. Headache in pregnancy, antepartum, third trimester   3. Anemia during pregnancy in third trimester    P: Discharge home Keep schedule prenatal appts Discussed reasons to return to [place], NP  03/16/2015, 7:29 PM

## 2015-03-16 NOTE — Discharge Instructions (Signed)
Nonspecific Tachycardia Tachycardia is a faster than normal heartbeat (more than 100 beats per minute). In adults, the heart normally beats between 60 and 100 times a minute. A fast heartbeat may be a normal response to exercise or stress. It does not necessarily mean that something is wrong. However, sometimes when your heart beats too fast it may not be able to pump enough blood to the rest of your body. This can result in chest pain, shortness of breath, dizziness, and even fainting. Nonspecific tachycardia means that the specific cause or pattern of your tachycardia is unknown. CAUSES  Tachycardia may be harmless or it may be due to a more serious underlying cause. Possible causes of tachycardia include:  Exercise or exertion.  Fever.  Pain or injury.  Infection.  Loss of body fluids (dehydration).  Overactive thyroid.  Lack of red blood cells (anemia).  Anxiety and stress.  Alcohol.  Caffeine.  Tobacco products.  Diet pills.  Illegal drugs.  Heart disease. SYMPTOMS  Rapid or irregular heartbeat (palpitations).  Suddenly feeling your heart beating (cardiac awareness).  Dizziness.  Tiredness (fatigue).  Shortness of breath.  Chest pain.  Nausea.  Fainting. DIAGNOSIS  Your caregiver will perform a physical exam and take your medical history. In some cases, a heart specialist (cardiologist) may be consulted. Your caregiver may also order:  Blood tests.  Electrocardiography. This test records the electrical activity of your heart.  A heart monitoring test. TREATMENT  Treatment will depend on the likely cause of your tachycardia. The goal is to treat the underlying cause of your tachycardia. Treatment methods may include:  Replacement of fluids or blood through an intravenous (IV) tube for moderate to severe dehydration or anemia.  New medicines or changes in your current medicines.  Diet and lifestyle changes.  Treatment for certain  infections.  Stress relief or relaxation methods. HOME CARE INSTRUCTIONS   Rest.  Drink enough fluids to keep your urine clear or pale yellow.  Do not smoke.  Avoid:  Caffeine.  Tobacco.  Alcohol.  Chocolate.  Stimulants such as over-the-counter diet pills or pills that help you stay awake.  Situations that cause anxiety or stress.  Illegal drugs such as marijuana, phencyclidine (PCP), and cocaine.  Only take medicine as directed by your caregiver.  Keep all follow-up appointments as directed by your caregiver. SEEK IMMEDIATE MEDICAL CARE IF:   You have pain in your chest, upper arms, jaw, or neck.  You become weak, dizzy, or feel faint.  You have palpitations that will not go away.  You vomit, have diarrhea, or pass blood in your stool.  Your skin is cool, pale, and wet.  You have a fever that will not go away with rest, fluids, and medicine. MAKE SURE YOU:   Understand these instructions.  Will watch your condition.  Will get help right away if you are not doing well or get worse.   This information is not intended to replace advice given to you by your health care provider. Make sure you discuss any questions you have with your health care provider.   Document Released: 06/30/2004 Document Revised: 08/15/2011 Document Reviewed: 12/05/2014 Elsevier Interactive Patient Education 2016 [employer].   Hypertension During Pregnancy Hypertension is also called high blood pressure. Blood pressure moves blood in your body. Sometimes, the force that moves the blood becomes too strong. When you are pregnant, this condition should be watched carefully. It can cause problems for you and your baby. HOME CARE   Make  and keep all of your doctor visits.  Take medicine as told by your doctor. Tell your doctor about all medicines you take.  Eat very little salt.  Exercise regularly.  Do not drink alcohol.  Do not smoke.  Do not have drinks with  caffeine.  Lie on your left side when resting.  Your health care provider may ask you to take one low-dose aspirin (81mg ) each day. GET HELP RIGHT AWAY IF:  You have bad belly (abdominal) pain.  You have sudden puffiness (swelling) in the hands, ankles, or face.  You gain 4 pounds (1.8 kilograms) or more in 1 week.  You throw up (vomit) repeatedly.  You have bleeding from the vagina.  You do not feel the baby moving as much.  You have a headache.  You have blurred or double vision.  You have muscle twitching or spasms.  You have shortness of breath.  You have blue fingernails and lips.  You have blood in your pee (urine). MAKE SURE YOU:  Understand these instructions.  Will watch your condition.  Will get help right away if you are not doing well or get worse.   This information is not intended to replace advice given to you by your health care provider. Make sure you discuss any questions you have with your health care provider.   Document Released: 06/25/2010 Document Revised: 06/13/2014 Document Reviewed: 12/20/2012 Elsevier Interactive Patient Education 2016 [employer].   Pregnancy and Anemia Anemia is a condition in which the concentration of red blood cells or hemoglobin in the blood is below normal. Hemoglobin is a substance in red blood cells that carries oxygen to the tissues of the body. Anemia results in not enough oxygen reaching these tissues.  Anemia during pregnancy is common because the fetus uses more iron and folic acid as it is developing. Your body may not produce enough red blood cells because of this. Also, during pregnancy, the liquid part of the blood (plasma) increases by about 50%, and the red blood cells increase by only 25%. This lowers the concentration of the red blood cells and creates a natural anemia-like situation.  CAUSES  The most common cause of anemia during pregnancy is not having enough iron in the body to make red blood cells  (iron deficiency anemia). Other causes may include:  Folic acid deficiency.  Vitamin B12 deficiency.  Certain prescription or over-the-counter medicines.  Certain medical conditions or infections that destroy red blood cells.  A low platelet count and bleeding caused by antibodies that go through the placenta to the fetus from the mother's blood. SIGNS AND SYMPTOMS  Mild anemia may not be noticeable. If it becomes severe, symptoms may include:  Tiredness.  Shortness of breath, especially with exercise.  Weakness.  Fainting.  Pale looking skin.  Headaches.  Feeling a fast or irregular heartbeat (palpitations). DIAGNOSIS  The type of anemia is usually diagnosed from your family and medical history and blood tests. TREATMENT  Treatment of anemia during pregnancy depends on the cause of the anemia. Treatment can include:  Supplements of iron, vitamin H37, or folic acid.  A blood transfusion. This may be needed if blood loss is severe.  Hospitalization. This may be needed if there is significant continual blood loss.  Dietary changes. HOME CARE INSTRUCTIONS   Follow your dietitian's or health care provider's dietary recommendations.  Increase your vitamin C intake. This will help the stomach absorb more iron.  Eat a diet rich in iron. This would  include foods such as:  Liver.  Beef.  Whole grain bread.  Eggs.  Dried fruit.  Take iron and vitamins as directed by your health care provider.  Eat green leafy vegetables. These are a good source of folic acid. SEEK MEDICAL CARE IF:   You have frequent or lasting headaches.  You are looking pale.  You are bruising easily. SEEK IMMEDIATE MEDICAL CARE IF:   You have extreme weakness, shortness of breath, or chest pain.  You become dizzy or have trouble concentrating.  You have heavy vaginal bleeding.  You develop a rash.  You have bloody or black, tarry stools.  You faint.  You vomit up blood.  You  vomit repeatedly.  You have abdominal pain.  You have a fever or persistent symptoms for more than 2-3 days.  You have a fever and your symptoms suddenly get worse.  You are dehydrated. MAKE SURE YOU:   Understand these instructions.  Will watch your condition.  Will get help right away if you are not doing well or get worse.   This information is not intended to replace advice given to you by your health care provider. Make sure you discuss any questions you have with your health care provider.   Document Released: 05/20/2000 Document Revised: 03/13/2013 Document Reviewed: 01/02/2013 Elsevier Interactive Patient Education [employer].

## 2015-05-05 ENCOUNTER — Encounter (HOSPITAL_COMMUNITY): Payer: Self-pay | Admitting: *Deleted

## 2015-05-05 ENCOUNTER — Telehealth (HOSPITAL_COMMUNITY): Payer: Self-pay | Admitting: *Deleted

## 2015-05-05 LAB — OB RESULTS CONSOLE GBS: GBS: NEGATIVE

## 2015-05-05 NOTE — Telephone Encounter (Signed)
Preadmission screen  

## 2015-05-07 ENCOUNTER — Other Ambulatory Visit: Payer: Self-pay | Admitting: Obstetrics and Gynecology

## 2015-05-08 ENCOUNTER — Encounter (HOSPITAL_COMMUNITY): Payer: Self-pay

## 2015-05-08 ENCOUNTER — Inpatient Hospital Stay (HOSPITAL_COMMUNITY): Payer: BLUE CROSS/BLUE SHIELD | Admitting: Anesthesiology

## 2015-05-08 ENCOUNTER — Inpatient Hospital Stay (HOSPITAL_COMMUNITY)
Admission: RE | Admit: 2015-05-08 | Discharge: 2015-05-10 | DRG: 775 | Disposition: A | Discharge status: Home / Self Care | Payer: BLUE CROSS/BLUE SHIELD | Source: Ambulatory Visit | Attending: Obstetrics and Gynecology | Admitting: Obstetrics and Gynecology

## 2015-05-08 DIAGNOSIS — E282 Polycystic ovarian syndrome: Secondary | ICD-10-CM | POA: Diagnosis present

## 2015-05-08 DIAGNOSIS — F329 Major depressive disorder, single episode, unspecified: Secondary | ICD-10-CM | POA: Diagnosis present

## 2015-05-08 DIAGNOSIS — O9962 Diseases of the digestive system complicating childbirth: Secondary | ICD-10-CM | POA: Diagnosis present

## 2015-05-08 DIAGNOSIS — Z8249 Family history of ischemic heart disease and other diseases of the circulatory system: Secondary | ICD-10-CM

## 2015-05-08 DIAGNOSIS — Z6835 Body mass index (BMI) 35.0-35.9, adult: Secondary | ICD-10-CM | POA: Diagnosis not present

## 2015-05-08 DIAGNOSIS — O9902 Anemia complicating childbirth: Secondary | ICD-10-CM | POA: Diagnosis present

## 2015-05-08 DIAGNOSIS — O480 Post-term pregnancy: Secondary | ICD-10-CM | POA: Diagnosis present

## 2015-05-08 DIAGNOSIS — Z3A39 39 weeks gestation of pregnancy: Secondary | ICD-10-CM | POA: Diagnosis not present

## 2015-05-08 DIAGNOSIS — O99344 Other mental disorders complicating childbirth: Secondary | ICD-10-CM | POA: Diagnosis present

## 2015-05-08 DIAGNOSIS — Z809 Family history of malignant neoplasm, unspecified: Secondary | ICD-10-CM

## 2015-05-08 DIAGNOSIS — F419 Anxiety disorder, unspecified: Secondary | ICD-10-CM | POA: Diagnosis present

## 2015-05-08 DIAGNOSIS — Z833 Family history of diabetes mellitus: Secondary | ICD-10-CM

## 2015-05-08 DIAGNOSIS — E669 Obesity, unspecified: Secondary | ICD-10-CM | POA: Diagnosis present

## 2015-05-08 DIAGNOSIS — O99214 Obesity complicating childbirth: Secondary | ICD-10-CM | POA: Diagnosis present

## 2015-05-08 DIAGNOSIS — K219 Gastro-esophageal reflux disease without esophagitis: Secondary | ICD-10-CM | POA: Diagnosis present

## 2015-05-08 LAB — CBC
HCT: 33.6 % — ABNORMAL LOW (ref 36.0–46.0)
HEMOGLOBIN: 11.1 g/dL — AB (ref 12.0–15.0)
MCH: 27.2 pg (ref 26.0–34.0)
MCHC: 33 g/dL (ref 30.0–36.0)
MCV: 82.4 fL (ref 78.0–100.0)
PLATELETS: 188 10*3/uL (ref 150–400)
RBC: 4.08 MIL/uL (ref 3.87–5.11)
RDW: 15.1 % (ref 11.5–15.5)
WBC: 7.7 10*3/uL (ref 4.0–10.5)

## 2015-05-08 LAB — TYPE AND SCREEN
ABO/RH(D): A POS
ANTIBODY SCREEN: NEGATIVE

## 2015-05-08 LAB — ABO/RH: ABO/RH(D): A POS

## 2015-05-08 MED ORDER — EPINEPHRINE HCL 1 MG/ML IJ SOLN
0.3000 mg | Freq: Once | INTRAMUSCULAR | Status: DC | PRN
  Filled 2015-05-08: qty 1

## 2015-05-08 MED ORDER — FLEET ENEMA 7-19 GM/118ML RE ENEM: 1.0000 | ENEMA | Freq: Every day | RECTAL | Status: DC | PRN

## 2015-05-08 MED ORDER — OXYTOCIN 40 UNITS IN LACTATED RINGERS INFUSION - SIMPLE MED
1.0000 m[IU]/min | INTRAVENOUS | Status: DC
Start: 2015-05-08 — End: 2015-05-08
  Administered 2015-05-08: 2 m[IU]/min via INTRAVENOUS

## 2015-05-08 MED ORDER — BISACODYL 10 MG RE SUPP: 10.0000 mg | Freq: Every day | RECTAL | Status: DC | PRN

## 2015-05-08 MED ORDER — ACETAMINOPHEN 325 MG PO TABS: 650.0000 mg | ORAL_TABLET | ORAL | Status: DC | PRN

## 2015-05-08 MED ORDER — ONDANSETRON HCL 4 MG/2ML IJ SOLN: 4.0000 mg | INTRAMUSCULAR | Status: DC | PRN

## 2015-05-08 MED ORDER — LANOLIN HYDROUS EX OINT: TOPICAL_OINTMENT | CUTANEOUS | Status: DC | PRN

## 2015-05-08 MED ORDER — OXYTOCIN 40 UNITS IN LACTATED RINGERS INFUSION - SIMPLE MED
62.5000 mL/h | INTRAVENOUS | Status: DC
  Administered 2015-05-08: 62.5 mL/h via INTRAVENOUS
  Filled 2015-05-08: qty 1000

## 2015-05-08 MED ORDER — LIDOCAINE HCL (PF) 1 % IJ SOLN
INTRAMUSCULAR | Status: DC | PRN
  Administered 2015-05-08 (×2): 4 mL via EPIDURAL
  Administered 2015-05-08: 30 mL

## 2015-05-08 MED ORDER — OXYCODONE-ACETAMINOPHEN 5-325 MG PO TABS: 2.0000 | ORAL_TABLET | ORAL | Status: DC | PRN

## 2015-05-08 MED ORDER — CITRIC ACID-SODIUM CITRATE 334-500 MG/5ML PO SOLN: 30.0000 mL | ORAL | Status: DC | PRN

## 2015-05-08 MED ORDER — SENNOSIDES-DOCUSATE SODIUM 8.6-50 MG PO TABS
2.0000 | ORAL_TABLET | ORAL | Status: DC
  Administered 2015-05-08 – 2015-05-10 (×2): 2 via ORAL
  Filled 2015-05-08 (×2): qty 2

## 2015-05-08 MED ORDER — DIBUCAINE 1 % RE OINT: 1.0000 "application " | TOPICAL_OINTMENT | RECTAL | Status: DC | PRN

## 2015-05-08 MED ORDER — FLEET ENEMA 7-19 GM/118ML RE ENEM: 1.0000 | ENEMA | RECTAL | Status: DC | PRN

## 2015-05-08 MED ORDER — EPHEDRINE 5 MG/ML INJ
10.0000 mg | INTRAVENOUS | Status: DC | PRN
  Filled 2015-05-08: qty 2

## 2015-05-08 MED ORDER — CALCIUM CARBONATE ANTACID 500 MG PO CHEW
2.0000 | CHEWABLE_TABLET | Freq: Every day | ORAL | Status: DC
  Filled 2015-05-08: qty 2

## 2015-05-08 MED ORDER — TERBUTALINE SULFATE 1 MG/ML IJ SOLN
0.2500 mg | Freq: Once | INTRAMUSCULAR | Status: DC | PRN
  Filled 2015-05-08: qty 1

## 2015-05-08 MED ORDER — OXYCODONE-ACETAMINOPHEN 5-325 MG PO TABS: 1.0000 | ORAL_TABLET | ORAL | Status: DC | PRN

## 2015-05-08 MED ORDER — LACTATED RINGERS IV SOLN
INTRAVENOUS | Status: DC
  Administered 2015-05-08 (×2): via INTRAVENOUS

## 2015-05-08 MED ORDER — PROMETHAZINE HCL 25 MG PO TABS: 12.5000 mg | ORAL_TABLET | Freq: Four times a day (QID) | ORAL | Status: DC | PRN

## 2015-05-08 MED ORDER — ONDANSETRON HCL 4 MG/2ML IJ SOLN: 4.0000 mg | Freq: Four times a day (QID) | INTRAMUSCULAR | Status: DC | PRN

## 2015-05-08 MED ORDER — BENZOCAINE-MENTHOL 20-0.5 % EX AERO
1.0000 "application " | INHALATION_SPRAY | CUTANEOUS | Status: DC | PRN
  Administered 2015-05-09: 1 via TOPICAL
  Filled 2015-05-08: qty 56

## 2015-05-08 MED ORDER — OXYTOCIN BOLUS FROM INFUSION
500.0000 mL | INTRAVENOUS | Status: DC
  Administered 2015-05-08: 500 mL via INTRAVENOUS

## 2015-05-08 MED ORDER — PHENYLEPHRINE 40 MCG/ML (10ML) SYRINGE FOR IV PUSH (FOR BLOOD PRESSURE SUPPORT)
80.0000 ug | PREFILLED_SYRINGE | INTRAVENOUS | Status: DC | PRN
  Filled 2015-05-08: qty 20
  Filled 2015-05-08: qty 2

## 2015-05-08 MED ORDER — DIPHENHYDRAMINE HCL 50 MG/ML IJ SOLN: 12.5000 mg | INTRAMUSCULAR | Status: DC | PRN

## 2015-05-08 MED ORDER — ZOLPIDEM TARTRATE 5 MG PO TABS: 5.0000 mg | ORAL_TABLET | Freq: Every evening | ORAL | Status: DC | PRN

## 2015-05-08 MED ORDER — TETANUS-DIPHTH-ACELL PERTUSSIS 5-2.5-18.5 LF-MCG/0.5 IM SUSP: 0.5000 mL | Freq: Once | INTRAMUSCULAR | Status: DC

## 2015-05-08 MED ORDER — PRENATAL MULTIVITAMIN CH
1.0000 | ORAL_TABLET | Freq: Every day | ORAL | Status: DC
  Filled 2015-05-08 (×2): qty 1

## 2015-05-08 MED ORDER — FENTANYL 2.5 MCG/ML BUPIVACAINE 1/10 % EPIDURAL INFUSION (WH - ANES)
14.0000 mL/h | INTRAMUSCULAR | Status: DC | PRN
  Administered 2015-05-08 (×3): 14 mL/h via EPIDURAL
  Filled 2015-05-08 (×2): qty 125

## 2015-05-08 MED ORDER — DIPHENHYDRAMINE HCL 25 MG PO CAPS: 25.0000 mg | ORAL_CAPSULE | Freq: Four times a day (QID) | ORAL | Status: DC | PRN

## 2015-05-08 MED ORDER — HYDROCODONE-ACETAMINOPHEN 5-325 MG PO TABS: 1.0000 | ORAL_TABLET | Freq: Four times a day (QID) | ORAL | Status: DC | PRN

## 2015-05-08 MED ORDER — WITCH HAZEL-GLYCERIN EX PADS: 1.0000 "application " | MEDICATED_PAD | CUTANEOUS | Status: DC | PRN

## 2015-05-08 MED ORDER — SIMETHICONE 80 MG PO CHEW: 80.0000 mg | CHEWABLE_TABLET | ORAL | Status: DC | PRN

## 2015-05-08 MED ORDER — PRENATAL MULTIVITAMIN CH
1.0000 | ORAL_TABLET | Freq: Every day | ORAL | Status: DC
  Administered 2015-05-09 – 2015-05-10 (×2): 1 via ORAL

## 2015-05-08 MED ORDER — LIDOCAINE HCL (PF) 1 % IJ SOLN
30.0000 mL | INTRAMUSCULAR | Status: DC | PRN
  Filled 2015-05-08: qty 30

## 2015-05-08 MED ORDER — IBUPROFEN 600 MG PO TABS
600.0000 mg | ORAL_TABLET | Freq: Four times a day (QID) | ORAL | Status: DC
  Administered 2015-05-08 – 2015-05-10 (×7): 600 mg via ORAL
  Filled 2015-05-08 (×7): qty 1

## 2015-05-08 MED ORDER — COLESTIPOL HCL 1 G PO TABS
1.0000 g | ORAL_TABLET | Freq: Every day | ORAL | Status: DC
  Filled 2015-05-08 (×2): qty 1

## 2015-05-08 MED ORDER — ONDANSETRON HCL 4 MG PO TABS: 4.0000 mg | ORAL_TABLET | ORAL | Status: DC | PRN

## 2015-05-08 MED ORDER — EPINEPHRINE 0.3 MG/0.3ML IJ SOAJ: 0.3000 mg | Freq: Once | INTRAMUSCULAR | Status: DC

## 2015-05-08 MED ORDER — LACTATED RINGERS IV SOLN: 500.0000 mL | INTRAVENOUS | Status: DC | PRN

## 2015-05-08 NOTE — Progress Notes (Signed)
Patient ID: Krystal Carr, female   DOB: 1981-04-20, 34 y.o.   MRN: [id number] Cervix 4cm 80% arom clear fhr cat one

## 2015-05-08 NOTE — H&P (Signed)
Krystal Carr is a 34 y.o. female presenting at 38 weeks with favorable cervix for arom. Uncomplicated PNC. Maternal Medical History:  Fetal activity: Perceived fetal activity is normal.    Prenatal complications: no prenatal complications Prenatal Complications - Diabetes: none.    OB History    Gravida Para Term Preterm AB TAB SAB Ectopic Multiple Living   3 1 1  1  1   1      Past Medical History  Diagnosis Date  . Allergy   . Anxiety   . Depression   . PCOS (polycystic ovarian syndrome)   . Vaginal Pap smear, abnormal   . Hx of varicella   . History of bulimia    Past Surgical History  Procedure Laterality Date  . Appendectomy  1997  . Cholecystectomy  2004  . Tonsillectomy     Family History: family history includes Cancer in her paternal grandmother; Cancer (age of onset: 43) in her mother; Diabetes in her maternal grandmother; Heart disease in her father; Hyperlipidemia in her mother; Hypertension in her father. Social History:  reports that she has never smoked. She has never used smokeless tobacco. She reports that she does not drink alcohol or use illicit drugs.   Prenatal Transfer Tool  Maternal Diabetes: No Genetic Screening: Normal Maternal Ultrasounds/Referrals: Normal Fetal Ultrasounds or other Referrals:  None Maternal Substance Abuse:  No Significant Maternal Medications:  None Significant Maternal Lab Results:  None Other Comments:  None  ROS    There were no vitals taken for this visit. Maternal Exam:  Pelvis: adequate for delivery.   Cervix: 4 cn and 50 %  Physical Exam  Prenatal labs: ABO, Rh: A/Positive/-- (04/28 0000) Antibody: Negative (04/28 0000) Rubella: Immune (04/28 0000) RPR: Nonreactive (04/28 0000)  HBsAg: Negative (04/28 0000)  HIV: Non-reactive (04/28 0000)  GBS: Negative (11/29 0000)   Assessment/Plan: IUP at 39 week with favorable cervix for AROM Routine labor and delivery   Krystal Carr 05/08/2015, 8:25  AM

## 2015-05-08 NOTE — Progress Notes (Signed)
Patient ID: Krystal Carr, female   DOB: Feb 22, 1981, 34 y.o.   MRN: [id number] fhr cat one Cervix 4 and 50 % but very posterior Will start pit risk discussed.

## 2015-05-08 NOTE — Anesthesia Procedure Notes (Signed)
Epidural Patient location during procedure: OB Start time: 05/08/2015 12:43 PM  Staffing Anesthesiologist: Josephine Igo Performed by: anesthesiologist   Preanesthetic Checklist Completed: patient identified, site marked, surgical consent, pre-op evaluation, timeout performed, IV checked, risks and benefits discussed and monitors and equipment checked  Epidural Patient position: sitting Prep: site prepped and draped and DuraPrep Patient monitoring: continuous pulse ox and blood pressure Approach: midline Location: L3-L4 Injection technique: LOR air  Needle:  Needle type: Tuohy  Needle gauge: 17 G Needle length: 9 cm and 9 Needle insertion depth: 7 cm Catheter type: closed end flexible Catheter size: 19 Gauge Catheter at skin depth: 12 cm Test dose: negative and Other  Assessment Events: blood not aspirated, injection not painful, no injection resistance, negative IV test and no paresthesia  Additional Notes Patient identified. Risks and benefits discussed including failed block, incomplete  Pain control, post dural puncture headache, nerve damage, paralysis, blood pressure Changes, nausea, vomiting, reactions to medications-both toxic and allergic and post Partum back pain. All questions were answered. Patient expressed understanding and wished to proceed. Sterile technique was used throughout procedure. Epidural site was Dressed with sterile barrier dressing. No paresthesias, signs of intravascular injection Or signs of intrathecal spread were encountered.  Patient was more comfortable after the epidural was dosed. Please see RN's note for documentation of vital signs and FHR which are stable.

## 2015-05-08 NOTE — Anesthesia Preprocedure Evaluation (Signed)
Anesthesia Evaluation  Patient identified by MRN, date of birth, ID band Patient awake    Reviewed: Allergy & Precautions, Patient's Chart, lab work & pertinent test results  Airway Mallampati: III  TM Distance: >3 FB Neck ROM: Full    Dental no notable dental hx. (+) Teeth Intact   Pulmonary neg pulmonary ROS,    Pulmonary exam normal breath sounds clear to auscultation       Cardiovascular negative cardio ROS Normal cardiovascular exam Rhythm:Regular Rate:Normal     Neuro/Psych PSYCHIATRIC DISORDERS Anxiety Depression negative neurological ROS     GI/Hepatic Neg liver ROS, GERD  Medicated and Controlled,  Endo/Other  Obesity PCOS  Renal/GU   negative genitourinary   Musculoskeletal negative musculoskeletal ROS (+)   Abdominal (+) + obese,   Peds  Hematology  (+) anemia ,   Anesthesia Other Findings   Reproductive/Obstetrics (+) Pregnancy                             Anesthesia Physical Anesthesia Plan  ASA: II  Anesthesia Plan: Epidural   Post-op Pain Management:    Induction:   Airway Management Planned: Natural Airway  Additional Equipment:   Intra-op Plan:   Post-operative Plan:   Informed Consent: I have reviewed the patients History and Physical, chart, labs and discussed the procedure including the risks, benefits and alternatives for the proposed anesthesia with the patient or authorized representative who has indicated his/her understanding and acceptance.     Plan Discussed with: Anesthesiologist  Anesthesia Plan Comments:         Anesthesia Quick Evaluation

## 2015-05-09 ENCOUNTER — Inpatient Hospital Stay (HOSPITAL_COMMUNITY): Admission: RE | Admit: 2015-05-09 | Payer: BLUE CROSS/BLUE SHIELD | Source: Ambulatory Visit

## 2015-05-09 LAB — CBC
HEMATOCRIT: 31 % — AB (ref 36.0–46.0)
HEMOGLOBIN: 10.2 g/dL — AB (ref 12.0–15.0)
MCH: 27.2 pg (ref 26.0–34.0)
MCHC: 32.9 g/dL (ref 30.0–36.0)
MCV: 82.7 fL (ref 78.0–100.0)
Platelets: 175 10*3/uL (ref 150–400)
RBC: 3.75 MIL/uL — ABNORMAL LOW (ref 3.87–5.11)
RDW: 15.2 % (ref 11.5–15.5)
WBC: 10.8 10*3/uL — ABNORMAL HIGH (ref 4.0–10.5)

## 2015-05-09 LAB — SYPHILIS: RPR W/REFLEX TO RPR TITER AND TREPONEMAL ANTIBODIES, TRADITIONAL SCREENING AND DIAGNOSIS ALGORITHM: RPR Ser Ql: NONREACTIVE

## 2015-05-09 NOTE — Anesthesia Postprocedure Evaluation (Signed)
Anesthesia Post Note  Patient: Krystal Carr  Procedure(s) Performed: * No procedures listed *  Patient location during evaluation: Mother Baby Anesthesia Type: Epidural Level of consciousness: awake and alert Pain management: satisfactory to patient Vital Signs Assessment: post-procedure vital signs reviewed and stable Respiratory status: spontaneous breathing Cardiovascular status: stable Postop Assessment: no headache, no backache, epidural receding, patient able to bend at knees and no signs of nausea or vomiting Anesthetic complications: no    Last Vitals:  Filed Vitals:   05/09/15 0335 05/09/15 1329  BP: 112/65 113/56  Pulse: 90 77  Temp: 36.8 C 36.6 C  Resp: 18 18    Last Pain:  Filed Vitals:   05/09/15 1331  PainSc: Asleep                 Gable Odonohue

## 2015-05-09 NOTE — Progress Notes (Signed)
Post Partum Day one Subjective: no complaints  Objective: Blood pressure 112/65, pulse 90, temperature 98.2 F (36.8 C), temperature source Oral, resp. rate 18, height 5\' 5"  (1.651 m), weight 97.523 kg (215 lb), SpO2 100 %, unknown if currently breastfeeding.  Physical Exam:  General: alert Lochia: appropriate Uterine Fundus: firm Incision: na DVT Evaluation: No evidence of DVT seen on physical exam.   Recent Labs  05/08/15 0840 05/09/15 0537  HGB 11.1* 10.2*  HCT 33.6* 31.0*    Assessment/Plan: Plan for discharge tomorrow   LOS: 1 day   Krystal Carr S 05/09/2015, 8:37 AM

## 2015-05-09 NOTE — Lactation Note (Signed)
This note was copied from the chart of Krystal Carr. Lactation Consultation Note  Initial visit done.  Mom states baby is breastfeeding well and no concerns at present.  Reviewed breastfeeding basics and encouraged to call with concerns or assist.  Breastfeeding consultation services and support information given to patient.  Patient Name: Krystal Carr [id number] Date: 05/09/2015 Reason for consult: Initial assessment   Maternal Data Formula Feeding for Exclusion: No  Feeding Feeding Type: Breast Fed Length of feed: 15 min  LATCH Score/Interventions                      Lactation Tools Discussed/Used     Consult Status      Ave Filter 05/09/2015, 10:56 AM

## 2015-05-10 NOTE — Progress Notes (Signed)
CLINICAL SOCIAL WORK MATERNAL/CHILD NOTE  Patient Details  Name: Krystal Carr MRN: [id number] Date of Birth: 05/08/2013  Date: 05/10/2015  Clinical Social Worker Initiating Note: Cumi Bevel, LCSWDate/ Time Initiated: 05/10/15/0930   Child's Name: Krystal Carr   Legal Guardian:  (Parents Krystal and Krystal Carr)   Need for Interpreter: None   Date of Referral: 05/09/15   Reason for Referral: Other (Comment)   Referral Source: [facility]   Address: [address] [place], [place] [address]  Phone number:  ([phone])   Household Members: Minor Children, Spouse   Natural Supports (not living in the home): Extended Family, Immediate Family   Professional Supports:None   Employment:Full-time   Type of Work:     Education:     Financial Resources:Private Insurance   Other Resources:     Cultural/Religious Considerations Which May Impact Care:none noted  Strengths: Ability to meet basic needs , Home prepared for child    Risk Factors/Current Problems:  (Hx of anxiety and depression)   Cognitive State: Alert , Able to Concentrate    Mood/Affect: Happy    CSW Assessment: Acknowledged order for social work consult to assess mother's hx of Depression and anxiety. Met with mother who was pleasant and receptive to social work. She is married and has one other dependents age 32. MOB reports hx of anxiety and depression that was treated 2-3 years ago. She states that she felt she was dealing more with anxiety that was a result of work related stressors. Informed that once she changed jobs the symptoms resolved. She reportedly took medication for a short period of time. She denies any current symptoms of depression or anxiety. She denies any use of alcohol or illicit drug use during pregnancy. No acute social concerns noted or reported at this time. She reports having an excellent support  system. Mother informed of social work availability.  CSW Plan/Description:    Discussed signs/symptoms of PP Depression and available resources No further intervention required No barriers to discharge   Bevel, Cumi J, LCSW 05/10/2015, 3:54 PM  

## 2015-05-10 NOTE — Lactation Note (Signed)
This note was copied from the chart of Krystal Carr. Lactation Consultation Note  Patient Name: Krystal Carr [id number] Date: 05/10/2015   Visited with Mom on day of discharge, baby 60 hrs and post circumcision.  Discussed importance of skin to skin, and cue based feedings.  Engorgement prevention and treatment discussed.  Reminded Mom of OP lactation services available to her.  Answered questions about pumping, and supporting her milk supply.  Mom to return to work for 3-4 weeks only and then be a stay at home Mom.  To call prn.     Broadus John 05/10/2015, 11:19 AM

## 2015-05-10 NOTE — Discharge Summary (Signed)
Obstetric Discharge Summary Reason for Admission: induction of labor Prenatal Procedures: none Intrapartum Procedures: spontaneous vaginal delivery Postpartum Procedures: none Complications-Operative and Postpartum: first degree perineal laceration HEMOGLOBIN  Date Value Ref Range Status  05/09/2015 10.2* 12.0 - 15.0 g/dL Final   HCT  Date Value Ref Range Status  05/09/2015 31.0* 36.0 - 46.0 % Final    Physical Exam:  General: alert Lochia: appropriate Uterine Fundus: firm Incision: na DVT Evaluation: No evidence of DVT seen on physical exam.  Discharge Diagnoses: Term Pregnancy-delivered  Discharge Information: Date: 05/10/2015 Activity: pelvic rest Diet: routine Medications: PNV and Ibuprofen Condition: stable Instructions: refer to practice specific booklet Discharge to: home   Newborn Data: Live born female  Birth Weight: 8 lb 3 oz (3714 g) APGAR: 8, 10  Home with mother.  Chris Narasimhan S 05/10/2015, 9:20 AM

## 2015-10-09 ENCOUNTER — Other Ambulatory Visit: Payer: Self-pay | Admitting: Nurse Practitioner

## 2015-10-09 DIAGNOSIS — N63 Unspecified lump in unspecified breast: Secondary | ICD-10-CM

## 2015-10-12 ENCOUNTER — Ambulatory Visit
Admission: RE | Admit: 2015-10-12 | Discharge: 2015-10-12 | Disposition: A | Discharge status: Home / Self Care | Payer: 59 | Source: Ambulatory Visit | Attending: Nurse Practitioner | Admitting: Nurse Practitioner

## 2015-10-12 ENCOUNTER — Other Ambulatory Visit: Payer: BLUE CROSS/BLUE SHIELD

## 2015-10-12 DIAGNOSIS — N63 Unspecified lump in unspecified breast: Secondary | ICD-10-CM

## 2016-10-26 DIAGNOSIS — S6992XA Unspecified injury of left wrist, hand and finger(s), initial encounter: Secondary | ICD-10-CM | POA: Insufficient documentation

## 2017-01-20 DIAGNOSIS — S6991XA Unspecified injury of right wrist, hand and finger(s), initial encounter: Secondary | ICD-10-CM | POA: Insufficient documentation

## 2017-05-01 NOTE — H&P (Signed)
NAMEPALAK, Carr NO.:  [id number]  MEDICAL RECORD NO.:  [id number]  LOCATION:                                 FACILITY:  PHYSICIAN:  Darlyn Chamber, M.D.        DATE OF BIRTH:  DATE OF ADMISSION: DATE OF DISCHARGE:                             HISTORY & PHYSICAL   DATE OF SURGERY:  May 15, 2017.  HISTORY OF PRESENT ILLNESS:  The patient is a 36 year old gravid 2, para 2, married female, who presents for permanent sterilization.  The patient is desirous of permanent sterilization.  Alternatives were discussed.  The potential irreversibility of sterilization was explained.  A failure rate of 1 in 300 is quoted.  This can be in the form of ectopic pregnancy, requiring further surgical management.  The patient expressed understanding of the indication, risk, and alternatives.  ALLERGIES:  She is allergic to MINOCYCLINE, CECLOR, and IMITREX.  MEDICATIONS:  Colestid, Wellbutrin, and Zoloft.  PAST MEDICAL HISTORY:  Usual childhood diseases.  Does have a history of abnormal Pap smear as well as migraine headaches.  PREVIOUS SURGERIES:  Appendectomy, gallbladder surgery, tonsils removed, and 2 vaginal deliveries.  SOCIAL HISTORY:  Reveals no tobacco or alcohol use.  FAMILY HISTORY:  Noncontributory.  REVIEW OF SYSTEMS:  Noncontributory.  PHYSICAL EXAMINATION:  VITAL SIGNS:  The patient is afebrile.  Stable vital signs. LUNGS:  Clear. CARDIOVASCULAR SYSTEM:  Regular rate.  No murmurs or gallops. ABDOMEN:  Benign.  No mass, organomegaly, or tenderness. PELVIC:  Normal external genitalia.  Vaginal mucosa is clear.  Cervix unremarkable.  Uterus normal size, shape, and contour.  Adnexa free of mass or tenderness. EXTREMITIES:  Trace edema. NEUROLOGIC:  Grossly within normal limits.  IMPRESSION:  Multiparity, desires sterility.  PLAN:  The patient will undergo laparoscopic bilateral tubal fulguration.  Risks of surgery have been discussed including  the risk of infection.  Risk of hemorrhage that could require transfusion with the risk of AIDS or hepatitis. Risk of injury to adjacent organs such as bladder, bowel, ureters that could require further exploratory surgery.  Risk of deep venous thrombosis and pulmonary embolus.  The patient expressed understanding of the indications, risks, and alternatives.     Darlyn Chamber, M.D.     JSM/MEDQ  D:  05/01/2017  T:  05/01/2017  Job:  [id number]

## 2017-05-01 NOTE — H&P (Signed)
Patient name Krystal Carr, [place]urry DICTATION# [id number] CSN# [id number]  [facility], MD 05/01/2017 9:40 AM

## 2017-05-03 ENCOUNTER — Other Ambulatory Visit: Payer: Self-pay | Admitting: Obstetrics and Gynecology

## 2017-05-03 DIAGNOSIS — Z803 Family history of malignant neoplasm of breast: Secondary | ICD-10-CM

## 2017-05-08 ENCOUNTER — Encounter (HOSPITAL_BASED_OUTPATIENT_CLINIC_OR_DEPARTMENT_OTHER): Payer: Self-pay | Admitting: *Deleted

## 2017-05-09 ENCOUNTER — Other Ambulatory Visit: Payer: Self-pay

## 2017-05-09 ENCOUNTER — Encounter (HOSPITAL_COMMUNITY)
Admission: RE | Admit: 2017-05-09 | Discharge: 2017-05-09 | Disposition: A | Discharge status: Home / Self Care | Payer: 59 | Source: Ambulatory Visit | Attending: Obstetrics and Gynecology | Admitting: Obstetrics and Gynecology

## 2017-05-09 ENCOUNTER — Encounter (HOSPITAL_BASED_OUTPATIENT_CLINIC_OR_DEPARTMENT_OTHER): Payer: Self-pay | Admitting: *Deleted

## 2017-05-09 DIAGNOSIS — Z01818 Encounter for other preprocedural examination: Secondary | ICD-10-CM | POA: Insufficient documentation

## 2017-05-09 LAB — CBC
HCT: 39.1 % (ref 36.0–46.0)
Hemoglobin: 12.8 g/dL (ref 12.0–15.0)
MCH: 27.8 pg (ref 26.0–34.0)
MCHC: 32.7 g/dL (ref 30.0–36.0)
MCV: 85 fL (ref 78.0–100.0)
PLATELETS: 255 10*3/uL (ref 150–400)
RBC: 4.6 MIL/uL (ref 3.87–5.11)
RDW: 13.3 % (ref 11.5–15.5)
WBC: 6.7 10*3/uL (ref 4.0–10.5)

## 2017-05-09 LAB — HCG, SERUM, QUALITATIVE: PREG SERUM: NEGATIVE

## 2017-05-09 NOTE — Progress Notes (Addendum)
SPOKE W/ PT VIA PHONE FOR PRE-OP INTERVIEW.  NPO AFTER MN.  ARRIVE AT 0530.  GETTING CBC AND hCG SERUM LABS DONE TODAY.  WILL TAKE AM MEDS W/ SIPS OF WATER.   ADDENDUM:  CALLED AND SPOKE W/ PT VIA PHONE FOR UPDATE FOR SURGERY WHICH WAS CHANGED TO Friday 05-19-2017 AT 0945.  PT VERBALIZED UNDERSTANDING TO BE NPO AFTER MN AND ARRIVE AT 0745.  CURRENT LAB RESULTS IN CHART AND Epic.  WILL TAKE AM MEDS W/ SIPS OF WATER.

## 2017-05-19 ENCOUNTER — Ambulatory Visit (HOSPITAL_BASED_OUTPATIENT_CLINIC_OR_DEPARTMENT_OTHER): Payer: 59 | Admitting: Anesthesiology

## 2017-05-19 ENCOUNTER — Encounter (HOSPITAL_BASED_OUTPATIENT_CLINIC_OR_DEPARTMENT_OTHER): Payer: Self-pay

## 2017-05-19 ENCOUNTER — Ambulatory Visit (HOSPITAL_BASED_OUTPATIENT_CLINIC_OR_DEPARTMENT_OTHER)
Admission: RE | Admit: 2017-05-19 | Discharge: 2017-05-19 | Disposition: A | Discharge status: Home / Self Care | Payer: 59 | Source: Ambulatory Visit | Attending: Obstetrics and Gynecology | Admitting: Obstetrics and Gynecology

## 2017-05-19 ENCOUNTER — Encounter (HOSPITAL_BASED_OUTPATIENT_CLINIC_OR_DEPARTMENT_OTHER)
Admission: RE | Disposition: A | Discharge status: Home / Self Care | Payer: Self-pay | Source: Ambulatory Visit | Attending: Obstetrics and Gynecology

## 2017-05-19 DIAGNOSIS — F419 Anxiety disorder, unspecified: Secondary | ICD-10-CM | POA: Diagnosis not present

## 2017-05-19 DIAGNOSIS — Z302 Encounter for sterilization: Secondary | ICD-10-CM | POA: Diagnosis present

## 2017-05-19 DIAGNOSIS — Z79899 Other long term (current) drug therapy: Secondary | ICD-10-CM | POA: Diagnosis not present

## 2017-05-19 DIAGNOSIS — F329 Major depressive disorder, single episode, unspecified: Secondary | ICD-10-CM | POA: Diagnosis not present

## 2017-05-19 DIAGNOSIS — Z6834 Body mass index (BMI) 34.0-34.9, adult: Secondary | ICD-10-CM | POA: Insufficient documentation

## 2017-05-19 HISTORY — PX: LAPAROSCOPIC TUBAL LIGATION: SHX1937

## 2017-05-19 HISTORY — DX: Postgastric surgery syndromes: K91.1

## 2017-05-19 HISTORY — DX: Family history of other specified conditions: Z84.89

## 2017-05-19 HISTORY — DX: Benign neoplasm of pituitary gland: D35.2

## 2017-05-19 HISTORY — DX: Personal history of other diseases of the female genital tract: Z87.42

## 2017-05-19 LAB — POCT PREGNANCY, URINE: Preg Test, Ur: NEGATIVE

## 2017-05-19 SURGERY — LIGATION, FALLOPIAN TUBE, LAPAROSCOPIC
Anesthesia: General | Site: Abdomen | Laterality: Bilateral

## 2017-05-19 MED ORDER — DEXAMETHASONE SODIUM PHOSPHATE 4 MG/ML IJ SOLN
INTRAMUSCULAR | Status: DC | PRN
  Administered 2017-05-19: 10 mg via INTRAVENOUS

## 2017-05-19 MED ORDER — ONDANSETRON HCL 4 MG/2ML IJ SOLN
INTRAMUSCULAR | Status: DC | PRN
  Administered 2017-05-19: 4 mg via INTRAVENOUS

## 2017-05-19 MED ORDER — ONDANSETRON HCL 4 MG/2ML IJ SOLN
INTRAMUSCULAR | Status: AC
  Filled 2017-05-19: qty 2

## 2017-05-19 MED ORDER — PROPOFOL 10 MG/ML IV BOLUS
INTRAVENOUS | Status: DC | PRN
  Administered 2017-05-19: 70 mg via INTRAVENOUS
  Administered 2017-05-19: 180 mg via INTRAVENOUS

## 2017-05-19 MED ORDER — PROPOFOL 10 MG/ML IV BOLUS
INTRAVENOUS | Status: AC
  Filled 2017-05-19: qty 20

## 2017-05-19 MED ORDER — MIDAZOLAM HCL 2 MG/2ML IJ SOLN
INTRAMUSCULAR | Status: AC
  Filled 2017-05-19: qty 2

## 2017-05-19 MED ORDER — DEXAMETHASONE SODIUM PHOSPHATE 10 MG/ML IJ SOLN
INTRAMUSCULAR | Status: AC
  Filled 2017-05-19: qty 1

## 2017-05-19 MED ORDER — KETOROLAC TROMETHAMINE 30 MG/ML IJ SOLN
INTRAMUSCULAR | Status: AC
  Filled 2017-05-19: qty 1

## 2017-05-19 MED ORDER — FENTANYL CITRATE (PF) 100 MCG/2ML IJ SOLN
INTRAMUSCULAR | Status: DC | PRN
  Administered 2017-05-19: 50 ug via INTRAVENOUS

## 2017-05-19 MED ORDER — SUGAMMADEX SODIUM 200 MG/2ML IV SOLN
INTRAVENOUS | Status: DC | PRN
  Administered 2017-05-19: 200 mg via INTRAVENOUS

## 2017-05-19 MED ORDER — MIDAZOLAM HCL 5 MG/5ML IJ SOLN
INTRAMUSCULAR | Status: DC | PRN
  Administered 2017-05-19: 2 mg via INTRAVENOUS

## 2017-05-19 MED ORDER — PROMETHAZINE HCL 25 MG/ML IJ SOLN
6.2500 mg | INTRAMUSCULAR | Status: DC | PRN
  Filled 2017-05-19: qty 1

## 2017-05-19 MED ORDER — HYDROMORPHONE HCL 1 MG/ML IJ SOLN
0.2500 mg | INTRAMUSCULAR | Status: DC | PRN
  Administered 2017-05-19: 0.25 mg via INTRAVENOUS
  Filled 2017-05-19: qty 0.5

## 2017-05-19 MED ORDER — KETOROLAC TROMETHAMINE 30 MG/ML IJ SOLN
INTRAMUSCULAR | Status: DC | PRN
  Administered 2017-05-19: 30 mg via INTRAVENOUS

## 2017-05-19 MED ORDER — HYDROMORPHONE HCL 1 MG/ML IJ SOLN
INTRAMUSCULAR | Status: AC
  Filled 2017-05-19: qty 1

## 2017-05-19 MED ORDER — FENTANYL CITRATE (PF) 100 MCG/2ML IJ SOLN
INTRAMUSCULAR | Status: AC
  Filled 2017-05-19: qty 2

## 2017-05-19 MED ORDER — PROPOFOL 500 MG/50ML IV EMUL
INTRAVENOUS | Status: AC
  Filled 2017-05-19: qty 50

## 2017-05-19 MED ORDER — MENTHOL 3 MG MT LOZG
LOZENGE | OROMUCOSAL | Status: AC
  Filled 2017-05-19: qty 9

## 2017-05-19 MED ORDER — LACTATED RINGERS IV SOLN
INTRAVENOUS | Status: DC
  Administered 2017-05-19 (×2): via INTRAVENOUS
  Filled 2017-05-19: qty 1000

## 2017-05-19 MED ORDER — SCOPOLAMINE 1 MG/3DAYS TD PT72
1.0000 | MEDICATED_PATCH | TRANSDERMAL | Status: DC
  Administered 2017-05-19: 1.5 mg via TRANSDERMAL
  Administered 2017-05-19: 1 via TRANSDERMAL
  Filled 2017-05-19: qty 1

## 2017-05-19 MED ORDER — ROCURONIUM BROMIDE 100 MG/10ML IV SOLN
INTRAVENOUS | Status: DC | PRN
  Administered 2017-05-19: 5 mg via INTRAVENOUS
  Administered 2017-05-19: 30 mg via INTRAVENOUS

## 2017-05-19 MED ORDER — LIDOCAINE HCL (CARDIAC) 20 MG/ML IV SOLN
INTRAVENOUS | Status: DC | PRN
  Administered 2017-05-19: 60 mg via INTRAVENOUS

## 2017-05-19 MED ORDER — SCOPOLAMINE 1 MG/3DAYS TD PT72
MEDICATED_PATCH | TRANSDERMAL | Status: AC
  Filled 2017-05-19: qty 1

## 2017-05-19 MED ORDER — BUPIVACAINE HCL 0.25 % IJ SOLN
INTRAMUSCULAR | Status: DC | PRN
  Administered 2017-05-19: 5 mL

## 2017-05-19 SURGICAL SUPPLY — 46 items
APPLICATOR COTTON TIP 6IN STRL (MISCELLANEOUS) IMPLANT
CANISTER SUCT 3000ML PPV (MISCELLANEOUS) IMPLANT
CANISTER SUCTION 1200CC (MISCELLANEOUS) IMPLANT
CATH ROBINSON RED A/P 16FR (CATHETERS) ×3 IMPLANT
COVER MAYO STAND STRL (DRAPES) ×3 IMPLANT
DERMABOND ADVANCED (GAUZE/BANDAGES/DRESSINGS)
DERMABOND ADVANCED .7 DNX12 (GAUZE/BANDAGES/DRESSINGS) IMPLANT
DRSG OPSITE POSTOP 3X4 (GAUZE/BANDAGES/DRESSINGS) ×3 IMPLANT
DURAPREP 26ML APPLICATOR (WOUND CARE) ×3 IMPLANT
ELECT REM PT RETURN 9FT ADLT (ELECTROSURGICAL) ×3
ELECTRODE REM PT RTRN 9FT ADLT (ELECTROSURGICAL) ×1 IMPLANT
GLOVE BIO SURGEON STRL SZ 6.5 (GLOVE) ×2 IMPLANT
GLOVE BIO SURGEON STRL SZ7 (GLOVE) ×6 IMPLANT
GLOVE BIO SURGEONS STRL SZ 6.5 (GLOVE) ×1
GLOVE BIOGEL PI IND STRL 6.5 (GLOVE) ×1 IMPLANT
GLOVE BIOGEL PI IND STRL 7.5 (GLOVE) ×1 IMPLANT
GLOVE BIOGEL PI INDICATOR 6.5 (GLOVE) ×2
GLOVE BIOGEL PI INDICATOR 7.5 (GLOVE) ×2
GOWN STRL REUS W/TWL LRG LVL3 (GOWN DISPOSABLE) ×3 IMPLANT
GOWN STRL REUS W/TWL XL LVL3 (GOWN DISPOSABLE) ×3 IMPLANT
KIT RM TURNOVER CYSTO AR (KITS) ×3 IMPLANT
NEEDLE INSUFFLATION 14GA 120MM (NEEDLE) IMPLANT
NS IRRIG 500ML POUR BTL (IV SOLUTION) IMPLANT
PACK LAPAROSCOPY BASIN (CUSTOM PROCEDURE TRAY) ×3 IMPLANT
PACK TRENDGUARD 450 HYBRID PRO (MISCELLANEOUS) ×1 IMPLANT
PAD OB MATERNITY 4.3X12.25 (PERSONAL CARE ITEMS) ×3 IMPLANT
PAD PREP 24X48 CUFFED NSTRL (MISCELLANEOUS) ×3 IMPLANT
POUCH SPECIMEN RETRIEVAL 10MM (ENDOMECHANICALS) IMPLANT
SCISSORS LAP 5X45 EPIX DISP (ENDOMECHANICALS) IMPLANT
SEALER TISSUE G2 CVD JAW 45CM (ENDOMECHANICALS) IMPLANT
SET IRRIG TUBING LAPAROSCOPIC (IRRIGATION / IRRIGATOR) IMPLANT
SUT VIC AB 3-0 PS2 18 (SUTURE) ×2
SUT VIC AB 3-0 PS2 18XBRD (SUTURE) ×1 IMPLANT
SUT VICRYL 0 ENDOLOOP (SUTURE) IMPLANT
SUT VICRYL 0 UR6 27IN ABS (SUTURE) ×3 IMPLANT
SUT VICRYL 4-0 PS2 18IN ABS (SUTURE) IMPLANT
TOWEL OR 17X24 6PK STRL BLUE (TOWEL DISPOSABLE) ×6 IMPLANT
TRENDGUARD 450 HYBRID PRO PACK (MISCELLANEOUS) ×3
TROCAR BALLN 12MMX100 BLUNT (TROCAR) ×3 IMPLANT
TROCAR OPTI TIP 5M 100M (ENDOMECHANICALS) IMPLANT
TROCAR XCEL DIL TIP R 11M (ENDOMECHANICALS) IMPLANT
TUBE CONNECTING 12'X1/4 (SUCTIONS)
TUBE CONNECTING 12X1/4 (SUCTIONS) IMPLANT
TUBING INSUF HEATED (TUBING) ×3 IMPLANT
WARMER LAPAROSCOPE (MISCELLANEOUS) ×3 IMPLANT
WATER STERILE IRR 500ML POUR (IV SOLUTION) ×3 IMPLANT

## 2017-05-19 NOTE — Anesthesia Procedure Notes (Addendum)
Procedure Name: Intubation Date/Time: 05/19/2017 9:54 AM Performed by: Wanita Chamberlain, CRNA Pre-anesthesia Checklist: Patient identified, Emergency Drugs available, Suction available, Patient being monitored and Timeout performed Patient Re-evaluated:Patient Re-evaluated prior to induction Oxygen Delivery Method: Circle system utilized Preoxygenation: Pre-oxygenation with 100% oxygen Induction Type: IV induction Ventilation: Mask ventilation without difficulty Laryngoscope Size: Mac and 3 Grade View: Grade II Tube type: Oral Tube size: 7.0 mm Number of attempts: 2 Airway Equipment and Method: Stylet Placement Confirmation: ETT inserted through vocal cords under direct vision,  positive ETCO2,  breath sounds checked- equal and bilateral and CO2 detector Secured at: 21 cm Tube secured with: Tape Dental Injury: Teeth and Oropharynx as per pre-operative assessment  Comments: Small mouth, large tongue

## 2017-05-19 NOTE — Transfer of Care (Signed)
Immediate Anesthesia Transfer of Care Note  Patient: Krystal Carr  Procedure(s) Performed: LAPAROSCOPIC TUBAL LIGATION, cautery (Bilateral Abdomen)  Patient Location: PACU  Anesthesia Type:General  Level of Consciousness: awake, alert , oriented and patient cooperative  Airway & Oxygen Therapy: Patient Spontanous Breathing and Patient connected to nasal cannula oxygen  Post-op Assessment: Report given to RN and Post -op Vital signs reviewed and stable  Post vital signs: Reviewed and stable  Last Vitals:  Vitals:   05/19/17 0758  BP: (!) 139/97  Pulse: 88  Resp: 16  Temp: 37 C  SpO2: 98%    Last Pain:  Vitals:   05/19/17 0758  TempSrc: Oral      Patients Stated Pain Goal: 5 (96/75/91 6384)  Complications: No apparent anesthesia complications

## 2017-05-19 NOTE — H&P (Signed)
  History and physical exam unchanged 

## 2017-05-19 NOTE — Anesthesia Postprocedure Evaluation (Signed)
Anesthesia Post Note  Patient: Krystal Carr  Procedure(s) Performed: LAPAROSCOPIC TUBAL LIGATION, cautery (Bilateral Abdomen)     Patient location during evaluation: PACU Anesthesia Type: General Level of consciousness: sedated Pain management: pain level controlled Vital Signs Assessment: post-procedure vital signs reviewed and stable Respiratory status: spontaneous breathing and respiratory function stable Cardiovascular status: stable Postop Assessment: no apparent nausea or vomiting Anesthetic complications: no    Last Vitals:  Vitals:   05/19/17 1231 05/19/17 1242  BP: 115/81   Pulse: 84   Resp: 17 14  Temp:    SpO2: 93%     Last Pain:  Vitals:   05/19/17 1242  TempSrc:   PainSc: 5                  Emmalina Espericueta DANIEL

## 2017-05-19 NOTE — Anesthesia Preprocedure Evaluation (Addendum)
Anesthesia Evaluation  Patient identified by MRN, date of birth, ID band Patient awake    Reviewed: Allergy & Precautions, NPO status , Patient's Chart, lab work & pertinent test results  History of Anesthesia Complications Negative for: history of anesthetic complications  Airway Mallampati: II  TM Distance: >3 FB Neck ROM: Full    Dental no notable dental hx. (+) Dental Advisory Given, Teeth Intact   Pulmonary neg pulmonary ROS,    Pulmonary exam normal        Cardiovascular negative cardio ROS Normal cardiovascular exam Rhythm:Regular Rate:Normal     Neuro/Psych PSYCHIATRIC DISORDERS Anxiety Depression negative neurological ROS     GI/Hepatic negative GI ROS, Neg liver ROS,   Endo/Other  Morbid obesity  Renal/GU negative Renal ROS     Musculoskeletal   Abdominal   Peds  Hematology   Anesthesia Other Findings   Reproductive/Obstetrics                           Anesthesia Physical Anesthesia Plan  ASA: II  Anesthesia Plan: General   Post-op Pain Management:    Induction: Intravenous  PONV Risk Score and Plan: 4 or greater and Ondansetron, Dexamethasone, Scopolamine patch - Pre-op and Diphenhydramine  Airway Management Planned: Oral ETT  Additional Equipment:   Intra-op Plan:   Post-operative Plan: Extubation in OR  Informed Consent: I have reviewed the patients History and Physical, chart, labs and discussed the procedure including the risks, benefits and alternatives for the proposed anesthesia with the patient or authorized representative who has indicated his/her understanding and acceptance.   Dental advisory given  Plan Discussed with: CRNA and Anesthesiologist  Anesthesia Plan Comments:        Anesthesia Quick Evaluation

## 2017-05-19 NOTE — Discharge Instructions (Signed)

## 2017-05-19 NOTE — Op Note (Signed)
Patient name Dulcey, Riederer DICTATION# [id number] CSN# [id number]  [facility], MD 05/19/2017 10:32 AM

## 2017-05-20 NOTE — Op Note (Signed)
NAME:  Krystal Carr, Krystal Carr NO.:  [id number]  MEDICAL RECORD NO.:  [id number]  LOCATION:                                 FACILITY:  PHYSICIAN:  Darlyn Chamber, M.D.        DATE OF BIRTH:  DATE OF PROCEDURE:  05/19/2017 DATE OF DISCHARGE:                              OPERATIVE REPORT   PREOPERATIVE DIAGNOSIS:  Multipara, desires sterility.  POSTOPERATIVE DIAGNOSIS:  Multipara, desires sterility.  OPERATIVE PROCEDURES:  Open laparoscopy, laparoscopy, subsequent bilateral tubal fulguration.  SURGEON:  Darlyn Chamber, M.D.  ANESTHESIA:  General endotracheal.  ESTIMATED BLOOD LOSS:  Minimal.  PACKS AND DRAINS:  None.  INTRAOPERATIVE BLOOD PLACED:  None.  COMPLICATIONS:  None.  INDICATIONS:  As previously dictated.  Risks were explained including the risk of infection.  Risk of hemorrhage that could require transfusion with the risk of AIDS or hepatitis.  Risk of injury to adjacent organs, could require further exploratory surgery.  Risk of deep venous thrombosis and pulmonary embolus.  DESCRIPTION OF PROCEDURE:  The patient was taken to the OR, placed in a supine position.  After satisfactory level of general endotracheal anesthesia was obtained, the patient was placed in the dorsal lithotomy position using the Allen stirrups.  The patient's perineum was prepped out with Betadine.  Bladder was emptied by in-and-out catheterization. A Hulka tenaculum was put in place and secured.  The abdomen was prepped with DuraPrep.  After a time was set up, the patient was draped in a sterile field.  Subumbilical incision was made with knife and extended through the subcutaneous tissue.  The fascia was identified, entered sharply, incision in the fascia extended laterally.  Peritoneum was entered with blunt finger pressure.  She did have some omental adhesions.  Through the incision, no bowel was encountered.  Open laparoscopic trocar was put in place.  The abdomen was  insufflated with carbon dioxide.  Laparoscope was introduced again.  You could see the omental adhesions.  No bowel was involved.  Both lateral gutters were clear.  Uterus was elevated, was of normal size and shape.  Tubes and ovaries were unremarkable.  Mid segment of the each tube was identified and cauterized for a distance of 3 cm, cautery was then continued until resistance read 0.  The same segment of tubes were then re-cauterized, completely desiccating the tube.  At the end, both tubes were adequately coagulated and then identified by following them out to their fimbriated ends.  No evidence of injury to adjacent organs, no active bleeding. The abdomen was desufflated with carbon dioxide.  Trocars removed. Subumbilical fascia was closed with a figure-of-eight of 0 Vicryl.  Skin was closed with interrupted subcuticulars of 4-0 Vicryl and Dermabond. Hulka tenaculum was then removed.  The patient was taken out of dorsal lithotomy position.  Once alert and extubated, transferred to the recovery room in good condition.  Sponge, instrument, and needle counts were reported as correct by circulating nurse x2.     Darlyn Chamber, M.D.     JSM/MEDQ  D:  05/19/2017  T:  05/20/2017  Job:  [id number]

## 2017-05-22 ENCOUNTER — Encounter (HOSPITAL_BASED_OUTPATIENT_CLINIC_OR_DEPARTMENT_OTHER): Payer: Self-pay | Admitting: Obstetrics and Gynecology

## 2017-05-25 ENCOUNTER — Ambulatory Visit
Admission: RE | Admit: 2017-05-25 | Discharge: 2017-05-25 | Disposition: A | Discharge status: Home / Self Care | Payer: 59 | Source: Ambulatory Visit | Attending: Obstetrics and Gynecology | Admitting: Obstetrics and Gynecology

## 2017-05-25 DIAGNOSIS — Z803 Family history of malignant neoplasm of breast: Secondary | ICD-10-CM

## 2017-05-25 MED ORDER — GADOBENATE DIMEGLUMINE 529 MG/ML IV SOLN: 19.0000 mL | Freq: Once | INTRAVENOUS | Status: DC | PRN

## 2018-06-02 ENCOUNTER — Telehealth: Payer: 59 | Admitting: Nurse Practitioner

## 2018-06-02 DIAGNOSIS — J069 Acute upper respiratory infection, unspecified: Secondary | ICD-10-CM

## 2018-06-02 MED ORDER — BENZONATATE 100 MG PO CAPS: 100.0000 mg | ORAL_CAPSULE | Freq: Three times a day (TID) | ORAL | 0 refills | Status: DC | PRN

## 2018-06-02 NOTE — Progress Notes (Signed)
We are sorry you are not feeling well.  Here is how we plan to help!  Based on what you have shared with me, it looks like you may have a viral upper respiratory infection or a "common cold".  Colds are caused by a large number of viruses; however, rhinovirus is the most common cause.   Symptoms of the common cold vary from person to person, with common symptoms including sore throat, cough, and malaise.  A low-grade fever of 100.4 may present, but is often uncommon.  Symptoms vary however, and are closely related to a person's age or underlying illnesses.  The most common symptoms associated with the common cold are nasal discharge or congestion, cough, sneezing, headache and pressure in the ears and face.  Cold symptoms usually persist for about 3 to 10 days, but can last up to 2 weeks.  It is important to know that colds do not cause serious illness or complications in most cases.    The common cold is transmitted from person to person, with the most common method of transmission being a person's hands.  The virus is able to live on the skin and can infect other persons for up to 2 hours after direct contact.  Also, colds are transmitted when someone coughs or sneezes; thus, it is important to cover the mouth to reduce this risk.  To keep the spread of the common cold at [place], good hand hygiene is very important.  This is an infection that is most likely caused by a virus. There are no specific treatments for the common cold other than to help you with the symptoms until the infection runs its course.    For nasal congestion, you may use an oral decongestants such as Mucinex D or if you have glaucoma or high blood pressure use plain Mucinex.  Saline nasal spray or nasal drops can help and can safely be used as often as needed for congestion.    If you do not have a history of heart disease, hypertension, diabetes or thyroid disease, prostate/bladder issues or glaucoma, you may also use Sudafed to treat  nasal congestion.  It is highly recommended that you consult with a pharmacist or your primary care physician to ensure this medication is safe for you to take.     If you have a cough, you may use cough suppressants such as Delsym and Robitussin.  If you have glaucoma or high blood pressure, you can also use Coricidin HBP.   For cough I have prescribed for you A prescription cough medication called Tessalon Perles 100 mg. You may take 1-2 capsules every 8 hours as needed for cough  If you have a sore or scratchy throat, use a saltwater gargle-  to  teaspoon of salt dissolved in a 4-ounce to 8-ounce glass of warm water.  Gargle the solution for approximately 15-30 seconds and then spit.  It is important not to swallow the solution.  You can also use throat lozenges/cough drops and Chloraseptic spray to help with throat pain or discomfort.  Warm or cold liquids can also be helpful in relieving throat pain.  For headache, pain or general discomfort, you can use Ibuprofen or Tylenol as directed.   Some authorities believe that zinc sprays or the use of Echinacea may shorten the course of your symptoms.   HOME CARE . Only take medications as instructed by your medical team. . Be sure to drink plenty of fluids. Water is fine as well as  fruit juices, sodas and electrolyte beverages. You may want to stay away from caffeine or alcohol. If you are nauseated, try taking small sips of liquids. How do you know if you are getting enough fluid? Your urine should be a pale yellow or almost colorless. . Get rest. . Taking a steamy shower or using a humidifier may help nasal congestion and ease sore throat pain. You can place a towel over your head and breathe in the steam from hot water coming from a faucet. . Using a saline nasal spray works much the same way. . Cough drops, hard candies and sore throat lozenges may ease your cough. . Avoid close contacts especially the very young and the elderly . Cover your  mouth if you cough or sneeze . Always remember to wash your hands.   GET HELP RIGHT AWAY IF: . You develop worsening fever. . If your symptoms do not improve within 10 days . You develop yellow or green discharge from your nose over 3 days. . You have coughing fits . You develop a severe head ache or visual changes. . You develop shortness of breath, difficulty breathing or start having chest pain . Your symptoms persist after you have completed your treatment plan  MAKE SURE YOU   Understand these instructions.  Will watch your condition.  Will get help right away if you are not doing well or get worse.  Your e-visit answers were reviewed by a board certified advanced clinical practitioner to complete your personal care plan. Depending upon the condition, your plan could have included both over the counter or prescription medications. Please review your pharmacy choice. If there is a problem, you may call our nursing hot line at and have the prescription routed to another pharmacy. Your safety is important to [place]. If you have drug allergies check your prescription carefully.   You can use MyChart to ask questions about today's visit, request a non-urgent call back, or ask for a work or school excuse for 24 hours related to this e-Visit. If it has been greater than 24 hours you will need to follow up with your provider, or enter a new e-Visit to address those concerns. You will get an e-mail in the next two days asking about your experience.  I hope that your e-visit has been valuable and will speed your recovery. Thank you for using e-visits.

## 2018-12-28 ENCOUNTER — Other Ambulatory Visit: Payer: Self-pay

## 2018-12-28 ENCOUNTER — Encounter: Payer: Self-pay | Admitting: Sports Medicine

## 2018-12-28 ENCOUNTER — Ambulatory Visit: Payer: Managed Care, Other (non HMO) | Admitting: Sports Medicine

## 2018-12-28 VITALS — Temp 98.9°F | Resp 16

## 2018-12-28 DIAGNOSIS — L600 Ingrowing nail: Secondary | ICD-10-CM | POA: Diagnosis not present

## 2018-12-28 DIAGNOSIS — M79674 Pain in right toe(s): Secondary | ICD-10-CM | POA: Diagnosis not present

## 2018-12-28 DIAGNOSIS — G43909 Migraine, unspecified, not intractable, without status migrainosus: Secondary | ICD-10-CM | POA: Insufficient documentation

## 2018-12-28 MED ORDER — NEOMYCIN-POLYMYXIN-HC 3.5-10000-1 OT SOLN: OTIC | 0 refills | Status: AC

## 2018-12-28 NOTE — Patient Instructions (Signed)

## 2018-12-28 NOTE — Progress Notes (Signed)
Subjective: Krystal Carr is a 38 y.o. female patient presents to office today complaining of a moderately painful incurvated, red, hot, swollen medial>lateral nail border of the first toe on the Right foot. Admits 3 weeks ago she bumped the toe and saw PCP who put her on Bactrim and the toe got a little better but reports noticing the toe getting worse for a few days and noticed some redness today. Patient has treated this by trimming, epsom salt, and antibiotic cream. Patient denies fever/chills/nausea/vomitting/any other related constitutional symptoms at this time.  Review of Systems  All other systems reviewed and are negative.    Patient Active Problem List   Diagnosis Date Noted  . Migraines 12/28/2018  . Injury of finger of right hand 01/20/2017  . Injury of left wrist 10/26/2016  . Post-dates pregnancy 05/08/2015  . Spontaneous vaginal delivery 05/08/2015    Class: Status post  . 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 PCOS (polycystic ovarian syndrome) 08/05/2013  . Pituitary microadenoma ([place]) 08/05/2013  . 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 Hirsutism 03/27/2013  . Oligomenorrhea 03/27/2013    Current Outpatient Medications on File Prior to Visit  Medication Sig Dispense Refill  . buPROPion (WELLBUTRIN SR) 150 MG 12 hr tablet Take 150 mg by mouth 2 (two) times daily.    . colestipol (COLESTID) 1 G tablet Take 1 g by mouth every morning.     . sertraline (ZOLOFT) 100 MG tablet Take 100 mg by mouth at bedtime.     No current facility-administered medications on file prior to visit.     Allergies  Allergen Reactions  . Sumatriptan Swelling, Anaphylaxis and Shortness Of Breath    Facial and inside of throat swelling Neck swelling- Facial/ throat swelling Neck swelling-   . Ceclor [Cefaclor] Hives  . Minocycline Hives  . Other Rash    PUMPKIN    Objective:  There were no vitals filed for this visit.  General: Well developed, nourished, in no acute distress, alert  and oriented x3   Dermatology: Skin is warm, dry and supple bilateral. Right hallux nail appears to be  severely incurvated with hyperkeratosis formation at the distal aspects of  the medial and lateral nail borders. (+) Focal Erythema. (+) Edema. (-) serosanguous  drainage present. The remaining nails appear unremarkable at this time. There are no open sores, lesions or other signs of infection  present.  Vascular: Dorsalis Pedis artery and Posterior Tibial artery pedal pulses are 2/4 bilateral with immedate capillary fill time. Pedal hair growth present. No lower extremity edema.   Neruologic: Grossly intact via light touch bilateral.  Musculoskeletal: Tenderness to palpation of the Right hallux nail folds. Muscular strength within normal limits in all groups bilateral.   Assesement and Plan: Problem List Items Addressed This Visit    None    Visit Diagnoses    Ingrown nail    -  Primary   Toe pain, right          -Discussed treatment alternatives and plan of care; Explained permanent/temporary nail avulsion and post procedure course to patient. Patient elects for PNA on right  - After a verbal and written consent, injected 3 ml of a 50:50 mixture of 2% plain  lidocaine and 0.5% plain marcaine in a normal hallux block fashion. Next, a  betadine prep was performed. Anesthesia was tested and found to be appropriate.  The offending right hallux medial and lateral nail borders were then incised from the hyponychium to the epinychium. The offending nail  border was removed and cleared from the field. The area was curretted for any remaining nail or spicules. Phenol application performed and the area was then flushed with alcohol and dressed with antibiotic cream and a dry sterile dressing. -Patient was instructed to leave the dressing intact for today and begin soaking  in a weak solution of betadine or Epsom salt and water tomorrow. Patient was instructed to  soak for 15-20 minutes each  day and apply neosporin/corticosporin and a gauze or bandaid dressing each day. -Patient was instructed to monitor the toe for signs of infection and return to office if toe becomes red, hot or swollen. -Advised ice, elevation, and tylenol or motrin if needed for pain.  -Patient is to return in 2 weeks for follow up care/nail check or sooner if problems arise.  Landis Martins, DPM

## 2019-01-11 ENCOUNTER — Other Ambulatory Visit: Payer: Self-pay

## 2019-01-11 ENCOUNTER — Ambulatory Visit (INDEPENDENT_AMBULATORY_CARE_PROVIDER_SITE_OTHER): Payer: Managed Care, Other (non HMO) | Admitting: Sports Medicine

## 2019-01-11 ENCOUNTER — Encounter: Payer: Self-pay | Admitting: Sports Medicine

## 2019-01-11 VITALS — Temp 98.5°F | Resp 16

## 2019-01-11 DIAGNOSIS — Z9889 Other specified postprocedural states: Secondary | ICD-10-CM

## 2019-01-11 DIAGNOSIS — M79674 Pain in right toe(s): Secondary | ICD-10-CM

## 2019-01-11 NOTE — Progress Notes (Signed)
Subjective: Krystal Carr is a 38 y.o. female patient returns to office today for follow up evaluation after having Right/Left Hallux medial/lateral permanent nail avulsion performed on (12-28-18). Patient has been soaking using betadine/epsom salt and applying topical antibiotic drops covered with bandaid daily. Patient denies fever/chills/nausea/vomitting/any other related constitutional symptoms at this time.  Patient Active Problem List   Diagnosis Date Noted  . Migraines 12/28/2018  . Injury of finger of right hand 01/20/2017  . Injury of left wrist 10/26/2016  . Post-dates pregnancy 05/08/2015  . Spontaneous vaginal delivery 05/08/2015    Class: Status post  . 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 PCOS (polycystic ovarian syndrome) 08/05/2013  . Pituitary microadenoma ([place]) 08/05/2013  . 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 Hirsutism 03/27/2013  . Oligomenorrhea 03/27/2013    Current Outpatient Medications on File Prior to Visit  Medication Sig Dispense Refill  . buPROPion (WELLBUTRIN SR) 150 MG 12 hr tablet Take 150 mg by mouth 2 (two) times daily.    . colestipol (COLESTID) 1 G tablet Take 1 g by mouth every morning.     . neomycin-polymyxin-hydrocortisone (CORTISPORIN) OTIC solution Apply 2-3 drops to the ingrown toenail site twice daily. Cover with band-aid. 10 mL 0  . PARoxetine (PAXIL) 20 MG tablet Paxil 20 mg tablet  Take 1 tablet every day by oral route.    . sertraline (ZOLOFT) 100 MG tablet Take 100 mg by mouth at bedtime.     No current facility-administered medications on file prior to visit.     Allergies  Allergen Reactions  . Sumatriptan Swelling, Anaphylaxis and Shortness Of Breath    Facial and inside of throat swelling Neck swelling- Facial/ throat swelling Neck swelling-   . Ceclor [Cefaclor] Hives  . Minocycline Hives  . Other Rash    PUMPKIN    Objective:  General: Well developed, nourished, in no acute distress, alert and oriented x3    Dermatology: Skin is warm, dry and supple bilateral. Right  hallux medial and lateral nail bed appears to be clean, dry, with mild granular tissue and surrounding eschar/scab. (-) Erythema. (-) Edema. (-) serosanguous drainage present. The remaining nails appear unremarkable at this time. There are no other lesions or other signs of infection  present.  Neurovascular status: Intact. No lower extremity swelling; No pain with calf compression bilateral.  Musculoskeletal: Decreased tenderness to palpation of the Right hallux nail fold(s). Muscular strength within normal limits bilateral.   Assesement and Plan: Problem List Items Addressed This Visit    None    Visit Diagnoses    S/P nail surgery    -  Primary   Toe pain, right         -Examined patient  -Cleansed right hallux nail folds and gently scrubbed with peroxide and q-tip/curetted away eschar at site and applied antibiotic cream covered with bandaid.  -Discussed plan of care with patient. -Patient to now begin soaking in a weak solution of Epsom salt and warm water. Patient was instructed to soak for 15-20 minutes each day until the toe appears normal and there is no drainage, redness, tenderness, or swelling at the procedure site, and apply neosporin and a gauze or bandaid dressing each day as needed. May leave open to air at night. -Educated patient on long term care after nail surgery. -Patient was instructed to monitor the toe for reoccurrence and signs of infection; Patient advised to return to office or go to ER if toe becomes red, hot or swollen. -Patient is to return  as needed or sooner if problems arise.  Landis Martins, DPM

## 2019-11-01 ENCOUNTER — Other Ambulatory Visit: Payer: Self-pay | Admitting: Physician Assistant

## 2019-11-01 DIAGNOSIS — M7542 Impingement syndrome of left shoulder: Secondary | ICD-10-CM

## 2019-11-07 ENCOUNTER — Ambulatory Visit
Admission: RE | Admit: 2019-11-07 | Discharge: 2019-11-07 | Disposition: A | Discharge status: Home / Self Care | Payer: Managed Care, Other (non HMO) | Source: Ambulatory Visit | Attending: Physician Assistant | Admitting: Physician Assistant

## 2019-11-07 ENCOUNTER — Other Ambulatory Visit: Payer: Self-pay

## 2019-11-07 DIAGNOSIS — M7542 Impingement syndrome of left shoulder: Secondary | ICD-10-CM

## 2019-11-07 MED ORDER — SODIUM CHLORIDE (PF) 0.9 % IJ SOLN
10.0000 mL | INTRAMUSCULAR | Status: DC | PRN
  Administered 2019-11-07: 10 mL via INTRAVENOUS

## 2019-11-07 MED ORDER — GADOBUTROL 1 MMOL/ML IV SOLN
1.0000 mL | Freq: Once | INTRAVENOUS | Status: AC | PRN
  Administered 2019-11-07: 1 mL

## 2019-11-07 MED ORDER — IOHEXOL 180 MG/ML  SOLN
10.0000 mL | Freq: Once | INTRAMUSCULAR | Status: AC | PRN
  Administered 2019-11-07: 10 mL

## 2019-11-07 MED ORDER — LIDOCAINE HCL (PF) 1 % IJ SOLN
5.0000 mL | Freq: Once | INTRAMUSCULAR | Status: AC
  Administered 2019-11-07: 5 mL
  Filled 2019-11-07: qty 5

## 2021-06-06 IMAGING — MR MR SHOULDER*L* W/ CM
6 series · 40 of 40 positions shown · IV contrast (agent unspecified)
Comparison: None.

CLINICAL DATA: Left shoulder pain for 7 months.

EXAM:
MR ARTHROGRAM OF THE left SHOULDER
TECHNIQUE: Multiplanar, multisequence MR imaging of the left shoulder was
performed following the administration of intra-articular contrast.
CONTRAST:  See Injection Documentation.

[Series 5: T1 fat-sat · axial · left · 4.0mm · 0.55mm/px · z∈[-7,+113]mm · 6 of 25 slices shown (1 of 3)]
[im 1/25]
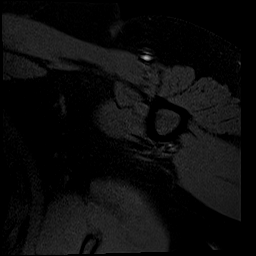
[im 5/25]
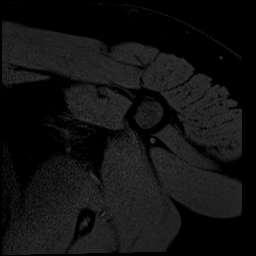
[im 10/25]
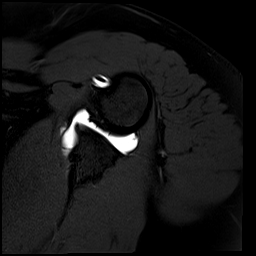
[im 15/25]
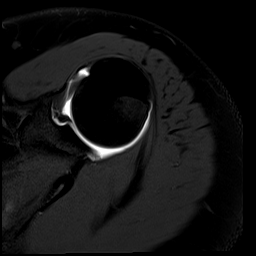
[im 20/25]
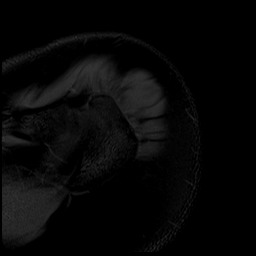
[im 25/25]
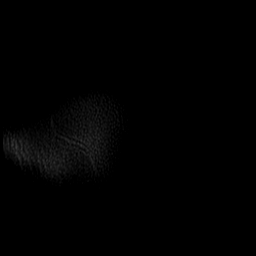

[Series 6: T1 fat-sat · oblique · left · 4.0mm · 0.55mm/px · 6 of 25 slices shown (2 of 3)]
[im 1/25]
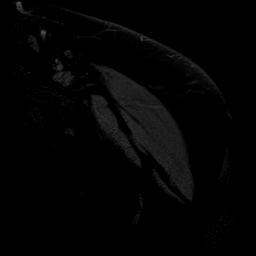
[im 5/25]
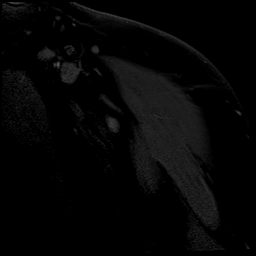
[im 10/25]
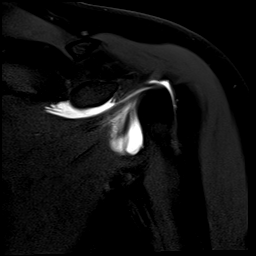
[im 15/25]
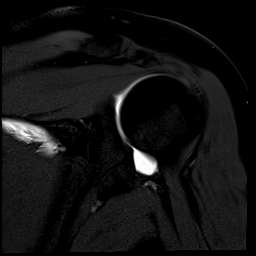
[im 20/25]
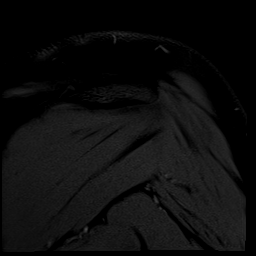
[im 25/25]
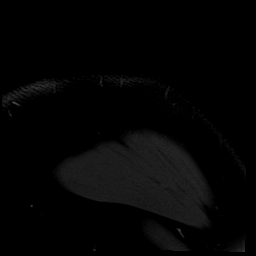

[Series 7: T2 fat-sat · oblique · left · 4.0mm · 0.55mm/px · 7 of 26 slices shown (1 of 2)]
[im 1/26]
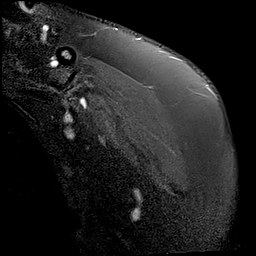
[im 5/26]
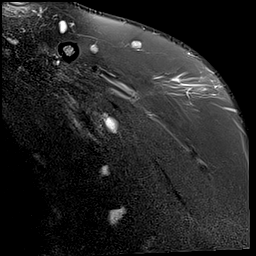
[im 9/26]
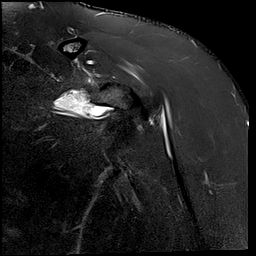
[im 13/26]
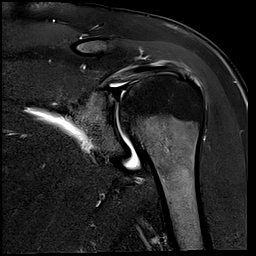
[im 17/26]
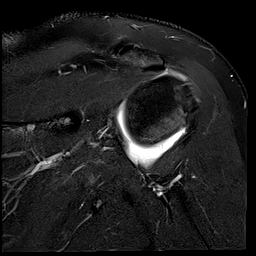
[im 21/26]
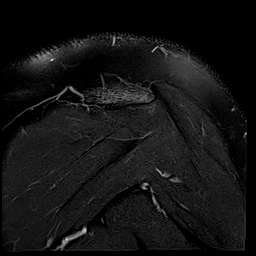
[im 26/26]
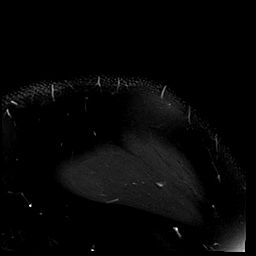

[Series 8: T1 · oblique · left · 4.0mm · 0.51mm/px · 7 of 26 slices shown]
[im 1/26]
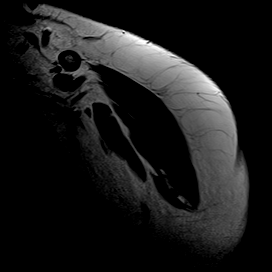
[im 5/26]
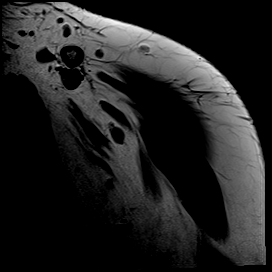
[im 9/26]
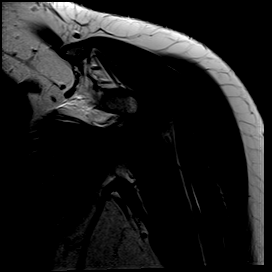
[im 13/26]
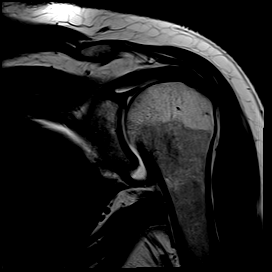
[im 17/26]
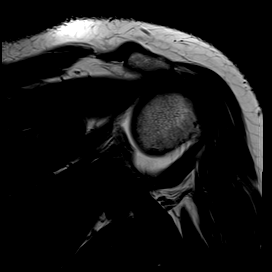
[im 21/26]
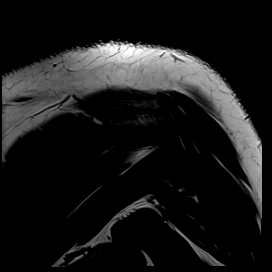
[im 26/26]
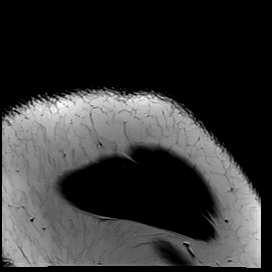

[Series 9: T2 fat-sat · oblique · left · 4.0mm · 0.55mm/px · 7 of 25 slices shown (2 of 2)]
[im 1/25]
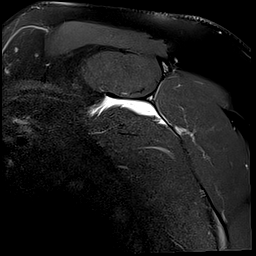
[im 5/25]
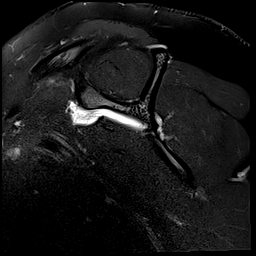
[im 9/25]
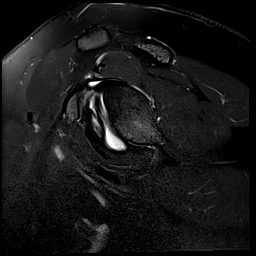
[im 13/25]
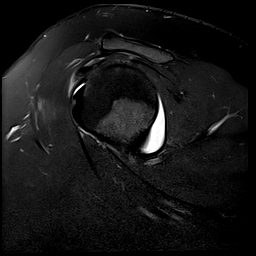
[im 17/25]
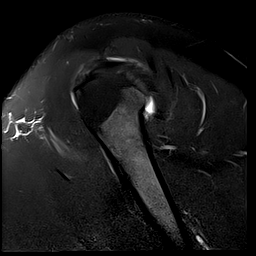
[im 21/25]
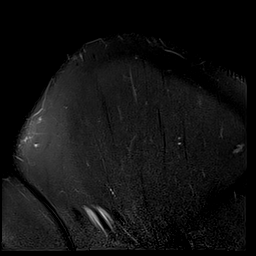
[im 25/25]
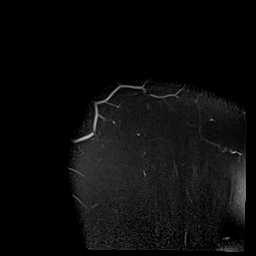

[Series 11: T1 fat-sat · sagittal · left · 4.0mm · 0.62mm/px · 7 of 26 slices shown (3 of 3)]
[im 1/26]
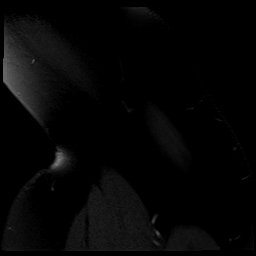
[im 5/26]
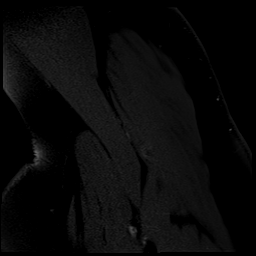
[im 9/26]
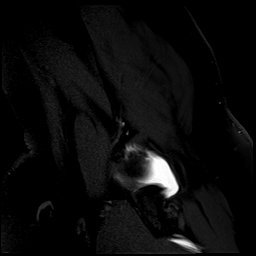
[im 13/26]
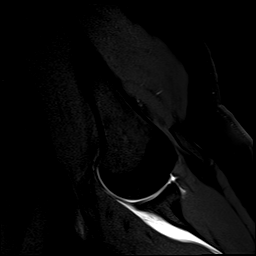
[im 17/26]
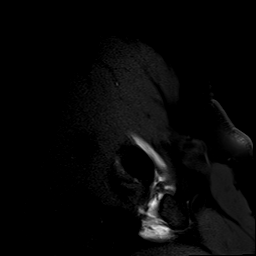
[im 21/26]
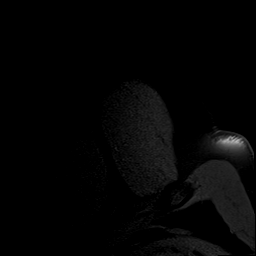
[im 26/26]
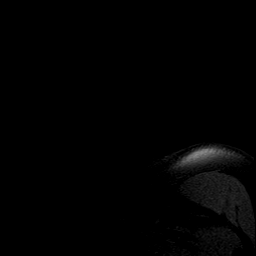

[40 of 40 positions shown; findings below may reference images not displayed]

FINDINGS: Rotator cuff: Moderate supraspinatus and infraspinatus
tendinopathy/tendinosis. There are small interstitial tears and
shallow bursal surface tears. No full-thickness retracted tear is
identified. The subscapularis tendon appears normal.

Muscles: No significant findings.

Biceps long head: Intact

Acromioclavicular Joint: No degenerative changes. The acromion is
type 2 in shape. Mild lateral downsloping but no undersurface
spurring.

Glenohumeral Joint: Normal articular cartilage. No loose bodies or
synovitis.

Labrum: No labral tears are identified. The glenohumeral ligaments
are intact. The anterior labrocapsular complex is intact on the ABER
sequence.

Bones: No acute bony findings.
IMPRESSION: 1. Moderate supraspinatus and infraspinatus tendinopathy/tendinosis
with small interstitial tears and shallow bursal surface tears. No
full-thickness retracted tear.
2. Intact long head biceps tendon, glenoid labrum, glenohumeral
ligaments and anterior labrocapsular complex.
3. No significant findings for bony impingement.

## 2023-02-14 ENCOUNTER — Other Ambulatory Visit: Payer: Self-pay | Admitting: Family Medicine

## 2023-02-14 DIAGNOSIS — Z1231 Encounter for screening mammogram for malignant neoplasm of breast: Secondary | ICD-10-CM

## 2023-02-17 ENCOUNTER — Ambulatory Visit
Admission: RE | Admit: 2023-02-17 | Discharge: 2023-02-17 | Disposition: A | Discharge status: Home / Self Care | Payer: No Typology Code available for payment source | Source: Ambulatory Visit

## 2023-02-17 DIAGNOSIS — Z1231 Encounter for screening mammogram for malignant neoplasm of breast: Secondary | ICD-10-CM

## 2023-12-27 ENCOUNTER — Encounter: Payer: Self-pay | Admitting: Family Medicine

## 2023-12-27 ENCOUNTER — Other Ambulatory Visit: Payer: Self-pay | Admitting: Family Medicine

## 2023-12-27 DIAGNOSIS — Z1231 Encounter for screening mammogram for malignant neoplasm of breast: Secondary | ICD-10-CM

## 2024-02-12 ENCOUNTER — Other Ambulatory Visit: Payer: Self-pay | Admitting: Family Medicine

## 2024-02-12 DIAGNOSIS — Z1231 Encounter for screening mammogram for malignant neoplasm of breast: Secondary | ICD-10-CM

## 2024-02-23 ENCOUNTER — Ambulatory Visit
Admission: RE | Admit: 2024-02-23 | Discharge: 2024-02-23 | Disposition: A | Discharge status: Home / Self Care | Payer: Self-pay | Source: Ambulatory Visit

## 2024-02-23 DIAGNOSIS — Z1231 Encounter for screening mammogram for malignant neoplasm of breast: Secondary | ICD-10-CM
